# Patient Record
Sex: Female | Born: 1940
Health system: Southern US, Community
[De-identification: ages and names within clinical notes are randomized; demographics above are authoritative.]

## PROBLEM LIST (undated history)

## (undated) DIAGNOSIS — M549 Dorsalgia, unspecified: Secondary | ICD-10-CM

## (undated) DIAGNOSIS — G8929 Other chronic pain: Secondary | ICD-10-CM

## (undated) DIAGNOSIS — K219 Gastro-esophageal reflux disease without esophagitis: Secondary | ICD-10-CM

## (undated) DIAGNOSIS — F32A Depression, unspecified: Secondary | ICD-10-CM

## (undated) DIAGNOSIS — F329 Major depressive disorder, single episode, unspecified: Secondary | ICD-10-CM

## (undated) DIAGNOSIS — F419 Anxiety disorder, unspecified: Secondary | ICD-10-CM

## (undated) DIAGNOSIS — M810 Age-related osteoporosis without current pathological fracture: Secondary | ICD-10-CM

## (undated) DIAGNOSIS — D649 Anemia, unspecified: Secondary | ICD-10-CM

## (undated) DIAGNOSIS — I639 Cerebral infarction, unspecified: Secondary | ICD-10-CM

## (undated) DIAGNOSIS — I10 Essential (primary) hypertension: Secondary | ICD-10-CM

## (undated) DIAGNOSIS — M797 Fibromyalgia: Secondary | ICD-10-CM

## (undated) DIAGNOSIS — M1990 Unspecified osteoarthritis, unspecified site: Secondary | ICD-10-CM

## (undated) HISTORY — PX: KNEE SURGERY: SHX244

## (undated) HISTORY — DX: Age-related osteoporosis without current pathological fracture: M81.0

## (undated) HISTORY — PX: GASTRIC BYPASS: SHX52

## (undated) HISTORY — PX: TONSILLECTOMY: SUR1361

## (undated) HISTORY — DX: Anxiety disorder, unspecified: F41.9

## (undated) HISTORY — PX: BACK SURGERY: SHX140

## (undated) HISTORY — DX: Depression, unspecified: F32.A

## (undated) HISTORY — DX: Major depressive disorder, single episode, unspecified: F32.9

## (undated) HISTORY — DX: Gastro-esophageal reflux disease without esophagitis: K21.9

## (undated) HISTORY — DX: Anemia, unspecified: D64.9

## (undated) HISTORY — DX: Fibromyalgia: M79.7

## (undated) HISTORY — PX: ABDOMINAL HYSTERECTOMY: SHX81

## (undated) HISTORY — DX: Unspecified osteoarthritis, unspecified site: M19.90

---

## 2010-02-02 DIAGNOSIS — G8929 Other chronic pain: Secondary | ICD-10-CM | POA: Insufficient documentation

## 2011-05-23 DIAGNOSIS — Z8673 Personal history of transient ischemic attack (TIA), and cerebral infarction without residual deficits: Secondary | ICD-10-CM | POA: Insufficient documentation

## 2012-04-25 DIAGNOSIS — I272 Pulmonary hypertension, unspecified: Secondary | ICD-10-CM | POA: Insufficient documentation

## 2012-04-25 DIAGNOSIS — Q249 Congenital malformation of heart, unspecified: Secondary | ICD-10-CM | POA: Insufficient documentation

## 2012-08-24 DIAGNOSIS — M961 Postlaminectomy syndrome, not elsewhere classified: Secondary | ICD-10-CM | POA: Insufficient documentation

## 2013-04-10 DIAGNOSIS — F419 Anxiety disorder, unspecified: Secondary | ICD-10-CM | POA: Insufficient documentation

## 2013-04-10 DIAGNOSIS — M792 Neuralgia and neuritis, unspecified: Secondary | ICD-10-CM | POA: Insufficient documentation

## 2015-09-17 DIAGNOSIS — H9190 Unspecified hearing loss, unspecified ear: Secondary | ICD-10-CM | POA: Insufficient documentation

## 2016-04-11 DIAGNOSIS — M19012 Primary osteoarthritis, left shoulder: Secondary | ICD-10-CM | POA: Insufficient documentation

## 2017-02-01 ENCOUNTER — Emergency Department (HOSPITAL_COMMUNITY)
Admission: EM | Admit: 2017-02-01 | Discharge: 2017-02-01 | Disposition: A | Discharge status: Home / Self Care | Payer: Medicare Other | Attending: Emergency Medicine | Admitting: Emergency Medicine

## 2017-02-01 ENCOUNTER — Emergency Department (HOSPITAL_COMMUNITY): Payer: Medicare Other

## 2017-02-01 ENCOUNTER — Encounter (HOSPITAL_COMMUNITY): Payer: Self-pay | Admitting: Emergency Medicine

## 2017-02-01 DIAGNOSIS — Z79899 Other long term (current) drug therapy: Secondary | ICD-10-CM | POA: Insufficient documentation

## 2017-02-01 DIAGNOSIS — R531 Weakness: Secondary | ICD-10-CM | POA: Insufficient documentation

## 2017-02-01 DIAGNOSIS — R112 Nausea with vomiting, unspecified: Secondary | ICD-10-CM | POA: Insufficient documentation

## 2017-02-01 DIAGNOSIS — R1033 Periumbilical pain: Secondary | ICD-10-CM | POA: Insufficient documentation

## 2017-02-01 DIAGNOSIS — I10 Essential (primary) hypertension: Secondary | ICD-10-CM | POA: Insufficient documentation

## 2017-02-01 DIAGNOSIS — R110 Nausea: Secondary | ICD-10-CM

## 2017-02-01 HISTORY — DX: Essential (primary) hypertension: I10

## 2017-02-01 HISTORY — DX: Other chronic pain: G89.29

## 2017-02-01 HISTORY — DX: Dorsalgia, unspecified: M54.9

## 2017-02-01 HISTORY — DX: Cerebral infarction, unspecified: I63.9

## 2017-02-01 LAB — COMPREHENSIVE METABOLIC PANEL
ALK PHOS: 56 U/L (ref 38–126)
ALT: 34 U/L (ref 14–54)
AST: 46 U/L — AB (ref 15–41)
Albumin: 3.8 g/dL (ref 3.5–5.0)
Anion gap: 6 (ref 5–15)
BUN: 11 mg/dL (ref 6–20)
CHLORIDE: 99 mmol/L — AB (ref 101–111)
CO2: 34 mmol/L — AB (ref 22–32)
CREATININE: 0.6 mg/dL (ref 0.44–1.00)
Calcium: 8.9 mg/dL (ref 8.9–10.3)
GFR calc Af Amer: >60 mL/min (ref >60)
GFR calc non Af Amer: >60 mL/min (ref >60)
Glucose, Bld: 115 mg/dL — ABNORMAL HIGH (ref 65–99)
Potassium: 3.1 mmol/L — ABNORMAL LOW (ref 3.5–5.1)
SODIUM: 139 mmol/L (ref 135–145)
Total Bilirubin: 0.7 mg/dL (ref 0.3–1.2)
Total Protein: 6.2 g/dL — ABNORMAL LOW (ref 6.5–8.1)

## 2017-02-01 LAB — TROPONIN I: Troponin I: <0.03 ng/mL (ref <0.03)

## 2017-02-01 LAB — URINALYSIS, ROUTINE W REFLEX MICROSCOPIC
BILIRUBIN URINE: NEGATIVE
GLUCOSE, UA: NEGATIVE mg/dL
HGB URINE DIPSTICK: NEGATIVE
KETONES UR: NEGATIVE mg/dL
Leukocytes, UA: NEGATIVE
Nitrite: NEGATIVE
PROTEIN: NEGATIVE mg/dL
Specific Gravity, Urine: 1.024 (ref 1.005–1.030)
pH: 6 (ref 5.0–8.0)

## 2017-02-01 LAB — CBC
HCT: 36.7 % (ref 36.0–46.0)
Hemoglobin: 12.4 g/dL (ref 12.0–15.0)
MCH: 35.6 pg — AB (ref 26.0–34.0)
MCHC: 33.8 g/dL (ref 30.0–36.0)
MCV: 105.5 fL — AB (ref 78.0–100.0)
PLATELETS: 310 10*3/uL (ref 150–400)
RBC: 3.48 MIL/uL — ABNORMAL LOW (ref 3.87–5.11)
RDW: 11.8 % (ref 11.5–15.5)
WBC: 4.9 10*3/uL (ref 4.0–10.5)

## 2017-02-01 LAB — LIPASE, BLOOD: LIPASE: 16 U/L (ref 11–51)

## 2017-02-01 MED ORDER — SODIUM CHLORIDE 0.9 % IV BOLUS (SEPSIS)
1000.0000 mL | Freq: Once | INTRAVENOUS | Status: AC
  Administered 2017-02-01: 1000 mL via INTRAVENOUS

## 2017-02-01 MED ORDER — ONDANSETRON HCL 4 MG PO TABS: 4.0000 mg | ORAL_TABLET | Freq: Four times a day (QID) | ORAL | 0 refills | Status: DC

## 2017-02-01 MED ORDER — ONDANSETRON HCL 4 MG/2ML IJ SOLN
4.0000 mg | Freq: Once | INTRAMUSCULAR | Status: AC
  Administered 2017-02-01: 4 mg via INTRAVENOUS
  Filled 2017-02-01: qty 2

## 2017-02-01 NOTE — ED Provider Notes (Signed)
[facility]Y DEPT Provider Note   CSN: [id number] Arrival date & time: 02/01/17  1030   By signing my name below, I, Bobbie Stack, attest that this documentation has been prepared under the direction and in the presence of Donnetta Hutching, MD. Electronically Signed: Bobbie Stack, Scribe. 02/01/17. 2:11 PM. History   Chief Complaint Chief Complaint  Patient presents with  . Emesis     The history is provided by the patient. No language interpreter was used.    HPI Comments: [name] Rojas is a 76 y.o. female who presents to the Emergency Department complaining of generalized abdominal pain for the past couple of days. She reports associated nausea and vomiting since yesterday. The patient states that she typically has this pain after having one of her weak spells. She states that her weak spells typically last for about 20 minutes. Patient reports having having two gastric bypasses in the past, one 20 years ago and the other 8 years ago. The patient also reports having chronic back pain and has had two back surgeries in the past. She denies diarrhea. Per family: They believe that the patient has lost several pounds over the past 10 days. They states that she has been eating fine. The patient is currently taking Tramadol and Zoloft.   Past Medical History:  Diagnosis Date  . Chronic back pain   . Hypertension   . Stroke [facility])     There are no active problems to display for this patient.   Past Surgical History:  Procedure Laterality Date  . ABDOMINAL HYSTERECTOMY    . BACK SURGERY    . GASTRIC BYPASS    . TONSILLECTOMY      OB History    No data available       Home Medications    Prior to Admission medications   Medication Sig Start Date End Date Taking? Authorizing Provider  Cholecalciferol (VITAMIN D3 PO) Take 1 tablet by mouth daily.   Yes Historical Provider, MD  Cyanocobalamin (VITAMIN B-12 PO) Take 1 tablet by mouth daily.   Yes Historical Provider, MD    hydrochlorothiazide (HYDRODIURIL) 25 MG tablet Take 1 tablet by mouth daily. 11/27/16  Yes Historical Provider, MD  HYDROcodone-acetaminophen (NORCO) 10-325 MG tablet Take 1 tablet by mouth 3 (three) times daily.  01/30/17  Yes Historical Provider, MD  loratadine (CLARITIN) 10 MG tablet Take 10 mg by mouth daily.   Yes Historical Provider, MD  Multiple Vitamin (MULTIVITAMIN WITH MINERALS) TABS tablet Take 1 tablet by mouth daily.   Yes Historical Provider, MD  Omega-3 Fatty Acids (FISH OIL PO) Take 1 capsule by mouth daily.   Yes Historical Provider, MD  omeprazole (PRILOSEC) 40 MG capsule Take 1 capsule by mouth daily. 11/23/16  Yes Historical Provider, MD  sertraline (ZOLOFT) 50 MG tablet Take 1 tablet by mouth daily. 01/25/17  Yes Historical Provider, MD  traMADol (ULTRAM) 50 MG tablet Take 1 tablet by mouth 3 (three) times daily. 01/30/17  Yes Historical Provider, MD  clonazePAM (KLONOPIN) 0.5 MG tablet Take 1 tablet by mouth daily as needed for anxiety. 11/22/16   Historical Provider, MD  KLOR-CON M10 10 MEQ tablet Take 1 tablet by mouth daily. 12/30/16   Historical Provider, MD  ondansetron (ZOFRAN) 4 MG tablet Take 1 tablet (4 mg total) by mouth every 6 (six) hours. 02/01/17   Donnetta Hutching, MD    Family History No family history on file.  Social History Social History  Substance Use Topics  . Smoking status:  Never Smoker  . Smokeless tobacco: Never Used  . Alcohol use No     Allergies   Patient has no known allergies.   Review of Systems Review of Systems A complete 10 system review of systems was obtained and all systems are negative except as noted in the HPI and PMH.   Physical Exam Updated Vital Signs BP 140/83 (BP Location: Left Arm)   Pulse 82   Temp 99.8 F (37.7 C)   Resp 18   Ht 5\' 4"  (1.626 m)   Wt 131 lb (59.4 kg)   SpO2 96%   BMI 22.49 kg/m   Physical Exam  Constitutional: She is oriented to person, place, and time. She appears well-developed and  well-nourished.  HENT:  Head: Normocephalic and atraumatic.  Eyes: Conjunctivae are normal.  Neck: Neck supple.  Cardiovascular: Normal rate and regular rhythm.   Pulmonary/Chest: Effort normal and breath sounds normal.  Abdominal: Soft. Bowel sounds are normal.  Minimal periumbilical tenderness.  Musculoskeletal: Normal range of motion.  Neurological: She is alert and oriented to person, place, and time.  Skin: Skin is warm and dry.  Psychiatric: She has a normal mood and affect. Her behavior is normal.  Nursing note and vitals reviewed.    ED Treatments / Results  DIAGNOSTIC STUDIES: Oxygen Saturation is 99% on RA, normal by my interpretation.    COORDINATION OF CARE: 1:55 PM Discussed treatment plan with pt at bedside and pt agreed to plan. I will check the patient's UA, blood work, and Museum/gallery curatorX-ray.  Labs (all labs ordered are listed, but only abnormal results are displayed) Labs Reviewed  COMPREHENSIVE METABOLIC PANEL - Abnormal; Notable for the following:       Result Value   Potassium 3.1 (*)    Chloride 99 (*)    CO2 34 (*)    Glucose, Bld 115 (*)    Total Protein 6.2 (*)    AST 46 (*)    All other components within normal limits  CBC - Abnormal; Notable for the following:    RBC 3.48 (*)    MCV 105.5 (*)    MCH 35.6 (*)    All other components within normal limits  URINALYSIS, ROUTINE W REFLEX MICROSCOPIC - Abnormal; Notable for the following:    APPearance HAZY (*)    All other components within normal limits  LIPASE, BLOOD  TROPONIN I    EKG  EKG Interpretation  Date/Time:  Wednesday February 01 2017 14:19:13 EST Ventricular Rate:  68 PR Interval:    QRS Duration: 93 QT Interval:  371 QTC Calculation: 395 R Axis:   3 Text Interpretation:  Sinus rhythm Atrial premature complexes Borderline short PR interval Borderline T wave abnormalities Confirmed by Adriana SimasOOK  MD, Carlyle Achenbach ([id number]) on 02/01/2017 2:25:56 PM       Radiology Dg Chest 2 View  Result Date:  02/01/2017 CLINICAL DATA:  Cough. EXAM: CHEST  2 VIEW COMPARISON:  No recent . FINDINGS: Mediastinum hilar structures normal. Lungs are clear. Mild cardiomegaly. No pulmonary venous congestion. No acute bony abnormality. Degenerative changes thoracic spine. Surgical clips and sutures upper abdomen. IMPRESSION: Mild cardiomegaly. No pulmonary venous congestion. No acute pulmonary disease. Electronically Signed   By: Maisie Fushomas  Register   On: 02/01/2017 14:47    Procedures Procedures (including critical care time)  Medications Ordered in ED Medications  sodium chloride 0.9 % bolus 1,000 mL (1,000 mLs Intravenous New Bag/Given 02/01/17 1348)  ondansetron (ZOFRAN) injection 4 mg (4 mg Intravenous Given 02/01/17  1348)     Initial Impression / Assessment and Plan / ED Course  I have reviewed the triage vital signs and the nursing notes.  Pertinent labs & imaging results that were available during my care of the patient were reviewed by me and considered in my medical decision making (see chart for details).     Patient is in no acute distress. Labs, chest Xray, UA, EKG all negative for acute findings.  Discharge medications Zofran 4 mg.  Final Clinical Impressions(s) / ED Diagnoses   Final diagnoses:  Weakness  Nausea    New Prescriptions New Prescriptions   ONDANSETRON (ZOFRAN) 4 MG TABLET    Take 1 tablet (4 mg total) by mouth every 6 (six) hours.   I personally performed the services described in this documentation, which was scribed in my presence. The recorded information has been reviewed and is accurate.     Donnetta Hutching, MD 02/01/17 1536

## 2017-02-01 NOTE — Discharge Instructions (Signed)
Tests showed no life-threatening condition. Prescription for nausea medicine. Follow-up your primary care doctor.

## 2017-02-01 NOTE — ED Triage Notes (Signed)
Pt c/o not feeling well x 1 week with n/v since yesterday. Denies diarrhea. Pt c/o chronic back pain.

## 2017-03-23 ENCOUNTER — Ambulatory Visit: Payer: Medicare Other | Admitting: Family Medicine

## 2017-03-30 ENCOUNTER — Encounter: Payer: Self-pay | Admitting: Family Medicine

## 2017-03-30 ENCOUNTER — Ambulatory Visit (INDEPENDENT_AMBULATORY_CARE_PROVIDER_SITE_OTHER): Payer: Medicare Other | Admitting: Family Medicine

## 2017-03-30 VITALS — BP 126/74 | HR 76 | Temp 97.3°F | Resp 16 | Ht 64.0 in | Wt 137.1 lb

## 2017-03-30 DIAGNOSIS — Z7689 Persons encountering health services in other specified circumstances: Secondary | ICD-10-CM | POA: Diagnosis not present

## 2017-03-30 DIAGNOSIS — M961 Postlaminectomy syndrome, not elsewhere classified: Secondary | ICD-10-CM

## 2017-03-30 DIAGNOSIS — K219 Gastro-esophageal reflux disease without esophagitis: Secondary | ICD-10-CM

## 2017-03-30 DIAGNOSIS — I10 Essential (primary) hypertension: Secondary | ICD-10-CM

## 2017-03-30 DIAGNOSIS — E349 Endocrine disorder, unspecified: Secondary | ICD-10-CM | POA: Diagnosis not present

## 2017-03-30 DIAGNOSIS — M797 Fibromyalgia: Secondary | ICD-10-CM | POA: Insufficient documentation

## 2017-03-30 DIAGNOSIS — L03032 Cellulitis of left toe: Secondary | ICD-10-CM | POA: Diagnosis not present

## 2017-03-30 DIAGNOSIS — Z9114 Patient's other noncompliance with medication regimen: Secondary | ICD-10-CM | POA: Diagnosis not present

## 2017-03-30 DIAGNOSIS — G8929 Other chronic pain: Secondary | ICD-10-CM

## 2017-03-30 DIAGNOSIS — M818 Other osteoporosis without current pathological fracture: Secondary | ICD-10-CM | POA: Diagnosis not present

## 2017-03-30 DIAGNOSIS — E559 Vitamin D deficiency, unspecified: Secondary | ICD-10-CM | POA: Diagnosis not present

## 2017-03-30 DIAGNOSIS — L600 Ingrowing nail: Secondary | ICD-10-CM | POA: Diagnosis not present

## 2017-03-30 DIAGNOSIS — M79672 Pain in left foot: Secondary | ICD-10-CM | POA: Diagnosis not present

## 2017-03-30 MED ORDER — POTASSIUM CHLORIDE CRYS ER 10 MEQ PO TBCR: 10.0000 meq | EXTENDED_RELEASE_TABLET | Freq: Every day | ORAL | 3 refills | Status: DC

## 2017-03-30 MED ORDER — TIZANIDINE HCL 4 MG PO TABS: 4.0000 mg | ORAL_TABLET | Freq: Four times a day (QID) | ORAL | 0 refills | Status: DC | PRN

## 2017-03-30 MED ORDER — VENLAFAXINE HCL ER 37.5 MG PO CP24: 37.5000 mg | ORAL_CAPSULE | Freq: Every day | ORAL | 1 refills | Status: DC

## 2017-03-30 NOTE — Patient Instructions (Signed)
Take the vitamin D as prescribed Walk every day that you are able  Take the tizanidine - muscle relaxer - to help with the back pai  Referral placed to pain clinic  Take the venlafaxine for anxiety Take one a day for 2 weeks If tolerated take 2 a day after this  See me in a month Need PE at that time

## 2017-03-30 NOTE — Progress Notes (Signed)
Chief Complaint  Patient presents with  . Establish Care   New to establish Extensive old records available in the extended record Primary care through [facility] in [place], [place] to [place] a couple of months ago to be close to son  Patient has long history of chronic pain and was followed through a Pain clinic in [place].  She has run out of her pain medicine because she took more than prescribed and is requesting enough for a "few days", even if it is only Tramadol. I explained that I do not do chronic pain and will not be giving her controlled substances.  I gave her tizanidine as a muscle relaxer as a one time Rx to assist her.  I referred her to a local pain clinic.  Patient has a long history of anxiety.  Prefers to take "valium or ativan or klonopin" because these work [place].  Has been on a number of other medicines including lexapro, sertraline, duloxetine, wellbutrin, buspar with issues of noncompliance and not following through with psychiatry consultations. I discussed with her I will NOT give her a benzo- derivative.  I will try effexor XR, and if this does not work OR causes side effects she will be referred to psychiatry for care..  Past history obesity with weight over 300 lbs and bariatric surgery X 2.  She didn't follow yo with vitamin supplement and became very vit D deficient.  Her DEXA showed over 25% reductin in bone mass from 2008 until 2018 and she now has severe osteoporosis.  Needs calcium and vitamin D before she can be put on a bisphosphonate.  Discussed calcium, vitamin D, weight bearing exercise.  History of BP.  Well controlled.  History of stroke.  No residual deficit.  Refuses Statin.  Is not on baby ASA.  Is told to go back on.  Chooses at 6 not to have mammogram or colon cancer screening.   Patient Active Problem List   Diagnosis Date Noted  . Fibromyalgia 03/30/2017  . History of medication noncompliance 03/30/2017  . Osteoporosis of lower leg  associated with endocrine disorder 03/30/2017  . Primary osteoarthritis of left shoulder 04/11/2016  . Hearing difficulty 09/17/2015  . Neuropathic pain 04/10/2013  . Generalized anxiety disorder 04/10/2013  . Failed back syndrome 08/24/2012  . Pulmonary hypertension 04/25/2012  . Congenital heart disease with intracardiac shunting 04/25/2012  . History of stroke 05/23/2011  . Status post total knee replacement 02/03/2010  . GERD (gastroesophageal reflux disease) 02/03/2010  . Chronic pain 02/02/2010  . Essential hypertension 01/15/2010  . Status post bariatric surgery 04/30/2009    Outpatient Encounter Prescriptions as of 03/30/2017  Medication Sig  . aspirin EC 81 MG tablet Take by mouth.  . calcium citrate-vitamin D 500-400 MG-UNIT chewable tablet Chew by mouth.  . gabapentin (NEURONTIN) 100 MG capsule Take 100 mg by mouth 2 (two) times daily.  . hydrochlorothiazide (HYDRODIURIL) 25 MG tablet Take 1 tablet by mouth daily.  Marland Kitchen HYDROcodone-acetaminophen (NORCO) 10-325 MG tablet Take 1 tablet by mouth 3 (three) times daily.   . IRON PO Take by mouth.  . loratadine (CLARITIN) 10 MG tablet Take 10 mg by mouth daily.  Marland Kitchen omeprazole (PRILOSEC) 40 MG capsule Take 1 capsule by mouth daily.  . potassium chloride (KLOR-CON M10) 10 MEQ tablet Take 1 tablet (10 mEq total) by mouth daily.  . traMADol (ULTRAM) 50 MG tablet Take 1 tablet by mouth 3 (three) times daily.  . Vitamin D, Ergocalciferol, (DRISDOL) 50000 units  CAPS capsule Take 50,000 Units by mouth every 7 (seven) days.  . [DISCONTINUED] KLOR-CON M10 10 MEQ tablet Take 1 tablet by mouth daily.  . [DISCONTINUED] Vitamin D, Ergocalciferol, (DRISDOL) 50000 units CAPS capsule Take by mouth.  Marland Kitchen tiZANidine (ZANAFLEX) 4 MG tablet Take 1 tablet (4 mg total) by mouth every 6 (six) hours as needed for muscle spasms.  Marland Kitchen venlafaxine XR (EFFEXOR XR) 37.5 MG 24 hr capsule Take 1 capsule (37.5 mg total) by mouth daily with breakfast.   No  facility-administered encounter medications on file as of 03/30/2017.     Past Medical History:  Diagnosis Date  . Anemia   . Anxiety   . Arthritis   . Chronic back pain   . Depression   . GERD (gastroesophageal reflux disease)   . Hypertension   . Osteoporosis   . Stroke [facility])     Past Surgical History:  Procedure Laterality Date  . ABDOMINAL HYSTERECTOMY     bleeding  . BACK SURGERY    . GASTRIC BYPASS    . KNEE SURGERY    . TONSILLECTOMY      Social History   Social History  . Marital status: Widowed    Spouse name: N/A  . Number of children: N/A  . Years of education: N/A   Occupational History  . Not on file.   Social History Main Topics  . Smoking status: Never Smoker  . Smokeless tobacco: Never Used  . Alcohol use No  . Drug use: No  . Sexual activity: Not Currently   Other Topics Concern  . Not on file   Social History Narrative  . No narrative on file    Family History  Problem Relation Age of Onset  . Arthritis Mother   . Asthma Mother   . Heart disease Mother   . Early death Father     trauma    Review of Systems  Constitutional: Negative for chills, fever and weight loss.  HENT: Negative for congestion and hearing loss.   Eyes: Negative for blurred vision and pain.  Respiratory: Negative for cough and shortness of breath.   Cardiovascular: Negative for chest pain and leg swelling.  Gastrointestinal: Negative for abdominal pain, constipation, diarrhea and heartburn.       Controlled  Genitourinary: Negative for dysuria and frequency.  Musculoskeletal: Positive for back pain and joint pain. Negative for falls and myalgias.  Neurological: Negative for dizziness, seizures and headaches.  Psychiatric/Behavioral: Negative for depression. The patient is nervous/anxious and has insomnia.     BP 126/74 (BP Location: Right Arm, Patient Position: Sitting, Cuff Size: Normal)   Pulse 76   Temp 97.3 F (36.3 C) (Temporal)   Resp 16   Ht 5'  4" (1.626 m)   Wt 137 lb 1.9 oz (62.2 kg)   SpO2 98%   BMI 23.54 kg/m   Physical Exam  Constitutional: She is oriented to person, place, and time. She appears well-developed and well-nourished. No distress.  Pleasant, talkative  HENT:  Head: Normocephalic and atraumatic.  Mouth/Throat: Oropharynx is clear and moist.  Eyes: Conjunctivae are normal. Pupils are equal, round, and reactive to light.  Neck: Normal range of motion.  Cardiovascular: Normal rate, regular rhythm and normal heart sounds.   Pulmonary/Chest: Effort normal and breath sounds normal.  Lymphadenopathy:    She has no cervical adenopathy.  Neurological: She is oriented to person, place, and time.  Psychiatric: She has a normal mood and affect. Her behavior is normal.  ASSESSMENT/PLAN:   1. Essential hypertension  - CBC - Comprehensive metabolic panel - Lipid panel - Urinalysis, Routine w reflex microscopic  2. Gastroesophageal reflux disease, esophagitis presence not specified   3. Other chronic pain  - Ambulatory referral to Pain Clinic  4. Failed back syndrome  - Ambulatory referral to Pain Clinic  5. History of medication noncompliance   6. Osteoporosis of lower leg associated with endocrine disorder  7. Vitamin D deficiency disease  - VITAMIN D 25 Hydroxy (Vit-D Deficiency, Fractures)  8. Encounter to establish care with new doctor  Greater than 50% of this visit was spent in counseling and coordinating care.  Total face to face time:  45 min    Patient Instructions  Take the vitamin D as prescribed Walk every day that you are able  Take the tizanidine - muscle relaxer - to help with the back pai  Referral placed to pain clinic  Take the venlafaxine for anxiety Take one a day for 2 weeks If tolerated take 2 a day after this  See me in a month Need PE at that time   Eustace Moore, MD

## 2017-03-31 ENCOUNTER — Telehealth: Payer: Self-pay | Admitting: Family Medicine

## 2017-03-31 NOTE — Telephone Encounter (Signed)
Patient calling to find out the status of an appointment with a  pain doctor.  She states this was discussed at last visit.

## 2017-03-31 NOTE — Telephone Encounter (Signed)
Please change pcp to dr nelson 

## 2017-04-03 NOTE — Telephone Encounter (Signed)
I placed a referral for her and do not know the status of this request

## 2017-04-04 NOTE — Telephone Encounter (Signed)
Faxed referral form and notes 04/03/17 to [facility].  She is under review, they will contact patient for an appt. I will call for updates every 2 days.

## 2017-04-13 DIAGNOSIS — M79671 Pain in right foot: Secondary | ICD-10-CM | POA: Diagnosis not present

## 2017-04-13 DIAGNOSIS — L03031 Cellulitis of right toe: Secondary | ICD-10-CM | POA: Diagnosis not present

## 2017-04-13 DIAGNOSIS — M79674 Pain in right toe(s): Secondary | ICD-10-CM | POA: Diagnosis not present

## 2017-04-13 DIAGNOSIS — L600 Ingrowing nail: Secondary | ICD-10-CM | POA: Diagnosis not present

## 2017-04-21 ENCOUNTER — Ambulatory Visit (INDEPENDENT_AMBULATORY_CARE_PROVIDER_SITE_OTHER): Payer: Medicare Other | Admitting: Family Medicine

## 2017-04-21 ENCOUNTER — Encounter: Payer: Self-pay | Admitting: Family Medicine

## 2017-04-21 VITALS — BP 146/92 | HR 88 | Temp 97.1°F | Resp 16 | Ht 64.0 in | Wt 136.0 lb

## 2017-04-21 DIAGNOSIS — N390 Urinary tract infection, site not specified: Secondary | ICD-10-CM | POA: Diagnosis not present

## 2017-04-21 DIAGNOSIS — F411 Generalized anxiety disorder: Secondary | ICD-10-CM | POA: Diagnosis not present

## 2017-04-21 LAB — POCT URINALYSIS DIPSTICK
BILIRUBIN UA: NEGATIVE
Glucose, UA: NEGATIVE
Nitrite, UA: NEGATIVE
PH UA: 5.5 (ref 5.0–8.0)
PROTEIN UA: NEGATIVE
RBC UA: NEGATIVE
Spec Grav, UA: 1.02 (ref 1.010–1.025)
Urobilinogen, UA: 0.2 E.U./dL

## 2017-04-21 MED ORDER — CIPROFLOXACIN HCL 250 MG PO TABS: 250.0000 mg | ORAL_TABLET | Freq: Two times a day (BID) | ORAL | 0 refills | Status: DC

## 2017-04-21 MED ORDER — VENLAFAXINE HCL ER 75 MG PO CP24: 75.0000 mg | ORAL_CAPSULE | Freq: Every day | ORAL | 3 refills | Status: DC

## 2017-04-21 NOTE — Progress Notes (Signed)
Chief Complaint  Patient presents with  . Urinary Tract Infection   Seen as a walk in for urine infection.  Has burning and frequency for 2 d.  No blood noted, no fever or flank pain. No suprapubic pain or incontinence.  Not recurrent, has not had one in years.  Also complains of anxiety.  Effexor not working.  I am going to increase her dose as she is only a starting dose of 37.5.  I explained it takes time to work.  She is wanting a benzodiazepine.  I will not prescribe.    Requests referral to psychiatry.  Patient Active Problem List   Diagnosis Date Noted  . Fibromyalgia 03/30/2017  . History of medication noncompliance 03/30/2017  . Osteoporosis of lower leg associated with endocrine disorder 03/30/2017  . Primary osteoarthritis of left shoulder 04/11/2016  . Hearing difficulty 09/17/2015  . Neuropathic pain 04/10/2013  . Generalized anxiety disorder 04/10/2013  . Failed back syndrome 08/24/2012  . Pulmonary hypertension (HCC) 04/25/2012  . Congenital heart disease with intracardiac shunting 04/25/2012  . History of stroke 05/23/2011  . Status post total knee replacement 02/03/2010  . GERD (gastroesophageal reflux disease) 02/03/2010  . Chronic pain 02/02/2010  . Essential hypertension 01/15/2010  . Status post bariatric surgery 04/30/2009    Outpatient Encounter Prescriptions as of 04/21/2017  Medication Sig  . aspirin EC 81 MG tablet Take by mouth.  . calcium citrate-vitamin D 500-400 MG-UNIT chewable tablet Chew by mouth.  . gabapentin (NEURONTIN) 100 MG capsule Take 100 mg by mouth 2 (two) times daily.  . hydrochlorothiazide (HYDRODIURIL) 25 MG tablet Take 1 tablet by mouth daily.  Marland Kitchen. HYDROcodone-acetaminophen (NORCO) 10-325 MG tablet Take 1 tablet by mouth 3 (three) times daily.   . IRON PO Take by mouth.  . loratadine (CLARITIN) 10 MG tablet Take 10 mg by mouth daily.  Marland Kitchen. omeprazole (PRILOSEC) 40 MG capsule Take 1 capsule by mouth daily.  . potassium chloride  (KLOR-CON M10) 10 MEQ tablet Take 1 tablet (10 mEq total) by mouth daily.  Marland Kitchen. tiZANidine (ZANAFLEX) 4 MG tablet Take 1 tablet (4 mg total) by mouth every 6 (six) hours as needed for muscle spasms.  . traMADol (ULTRAM) 50 MG tablet Take 1 tablet by mouth 3 (three) times daily.  Marland Kitchen. venlafaxine XR (EFFEXOR-XR) 75 MG 24 hr capsule Take 1 capsule (75 mg total) by mouth daily with breakfast.  . Vitamin D, Ergocalciferol, (DRISDOL) 50000 units CAPS capsule Take 50,000 Units by mouth every 7 (seven) days.  . [DISCONTINUED] venlafaxine XR (EFFEXOR XR) 37.5 MG 24 hr capsule Take 1 capsule (37.5 mg total) by mouth daily with breakfast.  . ciprofloxacin (CIPRO) 250 MG tablet Take 1 tablet (250 mg total) by mouth 2 (two) times daily.   No facility-administered encounter medications on file as of 04/21/2017.     Allergies  Allergen Reactions  . Codeine Nausea And Vomiting  . Duloxetine Nausea Only    Review of Systems  Constitutional: Negative for chills, fever and unexpected weight change.  HENT: Negative for congestion.   Eyes: Negative for visual disturbance.  Respiratory: Negative for cough and shortness of breath.   Cardiovascular: Negative for chest pain and palpitations.  Gastrointestinal: Negative for nausea and vomiting.  Genitourinary: Positive for dysuria and frequency. Negative for flank pain and hematuria.  Musculoskeletal: Negative for back pain.  Psychiatric/Behavioral: Negative for dysphoric mood. The patient is nervous/anxious.    BP (!) 146/92 (BP Location: Right Arm, Patient Position: Sitting,  Cuff Size: Normal)   Pulse 88   Temp 97.1 F (36.2 C) (Temporal)   Resp 16   Ht 5\' 4"  (1.626 m)   Wt 136 lb (61.7 kg)   SpO2 96%   BMI 23.34 kg/m   Physical Exam  Constitutional: She is oriented to person, place, and time. She appears well-developed and well-nourished. No distress.  HENT:  Head: Normocephalic and atraumatic.  Mouth/Throat: Oropharynx is clear and moist.    Cardiovascular: Normal rate, regular rhythm and normal heart sounds.   Pulmonary/Chest: Effort normal and breath sounds normal. She exhibits no tenderness.  Abdominal: Soft. Bowel sounds are normal. There is no tenderness.  Neurological: She is alert and oriented to person, place, and time.  Psychiatric: She has a normal mood and affect. Her behavior is normal.   Results for orders placed or performed in visit on 04/21/17  POCT urinalysis dipstick  Result Value Ref Range   Color, UA yellow    Clarity, UA sl cloudy    Glucose, UA neg    Bilirubin, UA neg    Ketones, UA trace    Spec Grav, UA 1.020 1.010 - 1.025   Blood, UA neg    pH, UA 5.5 5.0 - 8.0   Protein, UA neg    Urobilinogen, UA 0.2 0.2 or 1.0 E.U./dL   Nitrite, UA neg    Leukocytes, UA Trace (A) Negative     ASSESSMENT/PLAN:  1. Urinary tract infection without hematuria, site unspecified  - POCT urinalysis dipstick - Urine culture  2. Anxiety, generalized  - Ambulatory referral to Psychiatry   Patient Instructions  Take the cipro twice a day Drink lots of water Increase the effexor ( venlafaxine ) to 75 mg a day I will refer to psychiatry to help with the anxiety   Eustace Moore, MD

## 2017-04-21 NOTE — Patient Instructions (Signed)
Take the cipro twice a day Drink lots of water Increase the effexor ( venlafaxine ) to 75 mg a day I will refer to psychiatry to help with the anxiety

## 2017-04-24 DIAGNOSIS — I10 Essential (primary) hypertension: Secondary | ICD-10-CM | POA: Diagnosis not present

## 2017-04-24 DIAGNOSIS — E559 Vitamin D deficiency, unspecified: Secondary | ICD-10-CM | POA: Diagnosis not present

## 2017-04-24 LAB — LIPID PANEL
CHOL/HDL RATIO: 2.2 ratio (ref <5.0)
CHOLESTEROL: 150 mg/dL (ref <200)
HDL: 69 mg/dL (ref >50)
LDL CALC: 70 mg/dL (ref <100)
Triglycerides: 55 mg/dL (ref <150)
VLDL: 11 mg/dL (ref <30)

## 2017-04-24 LAB — CBC
HCT: 35.4 % (ref 35.0–45.0)
Hemoglobin: 11.8 g/dL (ref 11.7–15.5)
MCH: 35.5 pg — AB (ref 27.0–33.0)
MCHC: 33.3 g/dL (ref 32.0–36.0)
MCV: 106.6 fL — AB (ref 80.0–100.0)
MPV: 10.5 fL (ref 7.5–12.5)
PLATELETS: 240 10*3/uL (ref 140–400)
RBC: 3.32 MIL/uL — AB (ref 3.80–5.10)
RDW: 12.4 % (ref 11.0–15.0)
WBC: 3.5 10*3/uL — ABNORMAL LOW (ref 3.8–10.8)

## 2017-04-24 LAB — COMPREHENSIVE METABOLIC PANEL
ALT: 57 U/L — ABNORMAL HIGH (ref 6–29)
AST: 59 U/L — ABNORMAL HIGH (ref 10–35)
Albumin: 3.9 g/dL (ref 3.6–5.1)
Alkaline Phosphatase: 53 U/L (ref 33–130)
BUN: 10 mg/dL (ref 7–25)
CHLORIDE: 104 mmol/L (ref 98–110)
CO2: 31 mmol/L (ref 20–31)
CREATININE: 0.59 mg/dL — AB (ref 0.60–0.93)
Calcium: 9.1 mg/dL (ref 8.6–10.4)
GLUCOSE: 92 mg/dL (ref 65–99)
POTASSIUM: 3.9 mmol/L (ref 3.5–5.3)
SODIUM: 141 mmol/L (ref 135–146)
Total Bilirubin: 0.5 mg/dL (ref 0.2–1.2)
Total Protein: 6.1 g/dL (ref 6.1–8.1)

## 2017-04-24 LAB — URINE CULTURE

## 2017-04-25 ENCOUNTER — Ambulatory Visit (INDEPENDENT_AMBULATORY_CARE_PROVIDER_SITE_OTHER): Payer: Medicare Other | Admitting: Family Medicine

## 2017-04-25 ENCOUNTER — Encounter: Payer: Self-pay | Admitting: Family Medicine

## 2017-04-25 VITALS — BP 120/76 | HR 80 | Temp 98.6°F | Resp 18 | Ht 64.0 in | Wt 142.0 lb

## 2017-04-25 DIAGNOSIS — R252 Cramp and spasm: Secondary | ICD-10-CM | POA: Diagnosis not present

## 2017-04-25 LAB — VITAMIN D 25 HYDROXY (VIT D DEFICIENCY, FRACTURES): VIT D 25 HYDROXY: 31 ng/mL (ref 30–100)

## 2017-04-25 LAB — URINALYSIS, ROUTINE W REFLEX MICROSCOPIC
BILIRUBIN URINE: NEGATIVE
Glucose, UA: NEGATIVE
HGB URINE DIPSTICK: NEGATIVE
KETONES UR: NEGATIVE
Leukocytes, UA: NEGATIVE
NITRITE: NEGATIVE
Protein, ur: NEGATIVE
SPECIFIC GRAVITY, URINE: 1.017 (ref 1.001–1.035)
pH: 6 (ref 5.0–8.0)

## 2017-04-25 MED ORDER — TIZANIDINE HCL 4 MG PO TABS
4.0000 mg | ORAL_TABLET | Freq: Four times a day (QID) | ORAL | 0 refills | Status: DC | PRN
Start: 2017-04-25 — End: 2017-05-22

## 2017-04-25 NOTE — Progress Notes (Signed)
Chief Complaint  Patient presents with  . Leg Pain    x 48 hours   Acte visit Hand and foot cramps since the weekend No change in diet or activity Recent labs normal I didn't check thyroid She says she used to have thyroid disease   Patient Active Problem List   Diagnosis Date Noted  . Fibromyalgia 03/30/2017  . History of medication noncompliance 03/30/2017  . Osteoporosis of lower leg associated with endocrine disorder 03/30/2017  . Primary osteoarthritis of left shoulder 04/11/2016  . Hearing difficulty 09/17/2015  . Neuropathic pain 04/10/2013  . Generalized anxiety disorder 04/10/2013  . Failed back syndrome 08/24/2012  . Pulmonary hypertension (HCC) 04/25/2012  . Congenital heart disease with intracardiac shunting 04/25/2012  . History of stroke 05/23/2011  . Status post total knee replacement 02/03/2010  . GERD (gastroesophageal reflux disease) 02/03/2010  . Chronic pain 02/02/2010  . Essential hypertension 01/15/2010  . Status post bariatric surgery 04/30/2009    Outpatient Encounter Prescriptions as of 04/25/2017  Medication Sig  . aspirin EC 81 MG tablet Take by mouth.  . calcium citrate-vitamin D 500-400 MG-UNIT chewable tablet Chew by mouth.  . ciprofloxacin (CIPRO) 250 MG tablet Take 1 tablet (250 mg total) by mouth 2 (two) times daily.  Marland Kitchen. gabapentin (NEURONTIN) 100 MG capsule Take 100 mg by mouth 2 (two) times daily.  . hydrochlorothiazide (HYDRODIURIL) 25 MG tablet Take 1 tablet by mouth daily.  Marland Kitchen. HYDROcodone-acetaminophen (NORCO) 10-325 MG tablet Take 1 tablet by mouth 3 (three) times daily.   . IRON PO Take by mouth.  . loratadine (CLARITIN) 10 MG tablet Take 10 mg by mouth daily.  Marland Kitchen. omeprazole (PRILOSEC) 40 MG capsule Take 1 capsule by mouth daily.  . potassium chloride (KLOR-CON M10) 10 MEQ tablet Take 1 tablet (10 mEq total) by mouth daily.  Marland Kitchen. tiZANidine (ZANAFLEX) 4 MG tablet Take 1 tablet (4 mg total) by mouth every 6 (six) hours as needed for  muscle spasms.  . traMADol (ULTRAM) 50 MG tablet Take 1 tablet by mouth 3 (three) times daily.  Marland Kitchen. venlafaxine XR (EFFEXOR-XR) 75 MG 24 hr capsule Take 1 capsule (75 mg total) by mouth daily with breakfast.  . Vitamin D, Ergocalciferol, (DRISDOL) 50000 units CAPS capsule Take 50,000 Units by mouth every 7 (seven) days.  . [DISCONTINUED] tiZANidine (ZANAFLEX) 4 MG tablet Take 1 tablet (4 mg total) by mouth every 6 (six) hours as needed for muscle spasms.   No facility-administered encounter medications on file as of 04/25/2017.     Allergies  Allergen Reactions  . Codeine Nausea And Vomiting  . Duloxetine Nausea Only   Results for [name] GosselinBEST, [name] (MRN [id number]) as of 04/25/2017 16:43  Ref. Range 04/24/2017 09:08  Sodium Latest Ref Range: 135 - 146 mmol/L 141  Potassium Latest Ref Range: 3.5 - 5.3 mmol/L 3.9  Chloride Latest Ref Range: 98 - 110 mmol/L 104  CO2 Latest Ref Range: 20 - 31 mmol/L 31  Glucose Latest Ref Range: 65 - 99 mg/dL 92  BUN Latest Ref Range: 7 - 25 mg/dL 10  Creatinine Latest Ref Range: 0.60 - 0.93 mg/dL 5.780.59 (L)  Calcium Latest Ref Range: 8.6 - 10.4 mg/dL 9.1  Alkaline Phosphatase Latest Ref Range: 33 - 130 U/L 53  Albumin Latest Ref Range: 3.6 - 5.1 g/dL 3.9  AST Latest Ref Range: 10 - 35 U/L 59 (H)  ALT Latest Ref Range: 6 - 29 U/L 57 (H)  Total Protein Latest Ref Range: 6.1 -  8.1 g/dL 6.1  Total Bilirubin Latest Ref Range: 0.2 - 1.2 mg/dL 0.5  Total CHOL/HDL Ratio Latest Ref Range: <5.0 Ratio 2.2  Cholesterol Latest Ref Range: <200 mg/dL 782  HDL Cholesterol Latest Ref Range: >50 mg/dL 69  LDL (calc) Latest Ref Range: <100 mg/dL 70  Triglycerides Latest Ref Range: <150 mg/dL 55  VLDL Latest Ref Range: <30 mg/dL 11  Vitamin D, 95-AOZHYQM Latest Ref Range: 30 - 100 ng/mL 31  WBC Latest Ref Range: 3.8 - 10.8 K/uL 3.5 (L)  RBC Latest Ref Range: 3.80 - 5.10 MIL/uL 3.32 (L)  Hemoglobin Latest Ref Range: 11.7 - 15.5 g/dL 57.8  HCT Latest Ref Range: 35.0 - 45.0 % 35.4    MCV Latest Ref Range: 80.0 - 100.0 fL 106.6 (H)  MCH Latest Ref Range: 27.0 - 33.0 pg 35.5 (H)  MCHC Latest Ref Range: 32.0 - 36.0 g/dL 46.9  RDW Latest Ref Range: 11.0 - 15.0 % 12.4  Platelets Latest Ref Range: 140 - 400 K/uL 240  MPV Latest Ref Range: 7.5 - 12.5 fL 10.5  URINALYSIS, ROUTINE W REFLEX MICROSCOPIC Unknown Rpt  Appearance Latest Ref Range: CLEAR  CLEAR  Bilirubin Urine Latest Ref Range: NEGATIVE  NEGATIVE  Color, Urine Latest Ref Range: YELLOW  DARK YELLOW  Glucose Latest Ref Range: NEGATIVE  NEGATIVE  Hgb urine dipstick Latest Ref Range: NEGATIVE  NEGATIVE  Ketones, ur Latest Ref Range: NEGATIVE  NEGATIVE  Leukocytes, UA Latest Ref Range: NEGATIVE  NEGATIVE  Nitrite Latest Ref Range: NEGATIVE  NEGATIVE  pH Latest Ref Range: 5.0 - 8.0  6.0  Protein Latest Ref Range: NEGATIVE  NEGATIVE   Review of Systems  Constitutional: Negative for activity change, appetite change and unexpected weight change.  HENT: Negative for congestion, dental problem, postnasal drip and rhinorrhea.   Eyes: Negative for redness and visual disturbance.  Respiratory: Negative for cough and shortness of breath.   Cardiovascular: Negative for chest pain, palpitations and leg swelling.  Gastrointestinal: Negative for abdominal pain, constipation and diarrhea.  Genitourinary: Negative for difficulty urinating and frequency.  Musculoskeletal: Negative for arthralgias and back pain.       Muscle cramps  Neurological: Negative for dizziness and headaches.  Psychiatric/Behavioral: Negative for dysphoric mood and sleep disturbance. The patient is nervous/anxious.     BP 120/76 (BP Location: Right Arm, Patient Position: Sitting, Cuff Size: Normal)   Pulse 80   Temp 98.6 F (37 C) (Oral)   Resp 18   Ht 5\' 4"  (1.626 m)   Wt 142 lb (64.4 kg)   SpO2 98%   BMI 24.37 kg/m   Physical Exam  Constitutional: She is oriented to person, place, and time. She appears well-developed and well-nourished. No  distress.  Cardiovascular: Normal rate, regular rhythm and normal heart sounds.   Pulmonary/Chest: Effort normal and breath sounds normal.  Musculoskeletal: Normal range of motion.  Strength and sensation, grip, dexterity hands WNL  Neurological: She is alert and oriented to person, place, and time. She displays normal reflexes. She exhibits normal muscle tone. Coordination normal.  Skin: Skin is warm and dry. No rash noted.  Psychiatric: She has a normal mood and affect. Her behavior is normal. Thought content normal.    ASSESSMENT/PLAN:  1. Muscle cramp Discussed hydration.  Vitamin supplements.  stretching. - TSH   Patient Instructions  Get the thyroid test done today I will send you a letter with your test results.  If there is anything of concern, we will call right away.  Take a B complex vitamin daily I have refilled the muscle relaxer See me in a month ( cancel appt for next week)   Eustace Moore, MD

## 2017-04-25 NOTE — Patient Instructions (Addendum)
Get the thyroid test done today I will send you a letter with your test results.  If there is anything of concern, we will call right away. Take a B complex vitamin daily I have refilled the muscle relaxer See me in a month ( cancel appt for next week)

## 2017-04-26 ENCOUNTER — Encounter: Payer: Self-pay | Admitting: Family Medicine

## 2017-04-26 DIAGNOSIS — L03031 Cellulitis of right toe: Secondary | ICD-10-CM | POA: Diagnosis not present

## 2017-04-26 DIAGNOSIS — M79674 Pain in right toe(s): Secondary | ICD-10-CM | POA: Diagnosis not present

## 2017-04-26 DIAGNOSIS — L03032 Cellulitis of left toe: Secondary | ICD-10-CM | POA: Diagnosis not present

## 2017-04-26 DIAGNOSIS — L600 Ingrowing nail: Secondary | ICD-10-CM | POA: Diagnosis not present

## 2017-04-26 LAB — TSH: TSH: 0.67 m[IU]/L

## 2017-05-01 ENCOUNTER — Ambulatory Visit (INDEPENDENT_AMBULATORY_CARE_PROVIDER_SITE_OTHER): Payer: Medicare Other | Admitting: Otolaryngology

## 2017-05-01 ENCOUNTER — Encounter: Payer: Medicare Other | Admitting: Family Medicine

## 2017-05-01 DIAGNOSIS — H903 Sensorineural hearing loss, bilateral: Secondary | ICD-10-CM

## 2017-05-17 ENCOUNTER — Other Ambulatory Visit: Payer: Self-pay | Admitting: Family Medicine

## 2017-05-17 NOTE — Telephone Encounter (Signed)
Seen and filled [phone]

## 2017-05-17 NOTE — Telephone Encounter (Signed)
Patient is requesting a refill for muscle relaxer [facility]ses [facility] in [place] Cb#: [phone]

## 2017-05-18 ENCOUNTER — Telehealth: Payer: Self-pay | Admitting: Family Medicine

## 2017-05-18 NOTE — Telephone Encounter (Signed)
Pt aware rx sent to pharmacy.

## 2017-05-19 NOTE — Telephone Encounter (Signed)
Patient left message on nurse line  05/18/17 @3 :23  Requesting her muscle relaxer.    Pt cb  [phone]

## 2017-05-22 MED ORDER — TIZANIDINE HCL 4 MG PO TABS: 4.0000 mg | ORAL_TABLET | Freq: Four times a day (QID) | ORAL | 0 refills | Status: DC | PRN

## 2017-05-22 NOTE — Telephone Encounter (Signed)
rx sent  Pt aware. 

## 2017-05-26 ENCOUNTER — Encounter: Payer: Medicare Other | Admitting: Family Medicine

## 2017-05-30 ENCOUNTER — Telehealth (HOSPITAL_COMMUNITY): Payer: Self-pay | Admitting: *Deleted

## 2017-05-30 NOTE — Telephone Encounter (Signed)
left voice message regarding an appointment. 

## 2017-06-05 DIAGNOSIS — M79605 Pain in left leg: Secondary | ICD-10-CM | POA: Diagnosis not present

## 2017-06-05 DIAGNOSIS — M25511 Pain in right shoulder: Secondary | ICD-10-CM | POA: Diagnosis not present

## 2017-06-05 DIAGNOSIS — M545 Low back pain: Secondary | ICD-10-CM | POA: Diagnosis not present

## 2017-06-05 DIAGNOSIS — M797 Fibromyalgia: Secondary | ICD-10-CM | POA: Diagnosis not present

## 2017-06-05 DIAGNOSIS — M25569 Pain in unspecified knee: Secondary | ICD-10-CM | POA: Diagnosis not present

## 2017-06-05 DIAGNOSIS — M25519 Pain in unspecified shoulder: Secondary | ICD-10-CM | POA: Diagnosis not present

## 2017-06-05 DIAGNOSIS — Z79899 Other long term (current) drug therapy: Secondary | ICD-10-CM | POA: Diagnosis not present

## 2017-06-07 DIAGNOSIS — M2042 Other hammer toe(s) (acquired), left foot: Secondary | ICD-10-CM | POA: Diagnosis not present

## 2017-06-07 DIAGNOSIS — M2022 Hallux rigidus, left foot: Secondary | ICD-10-CM | POA: Diagnosis not present

## 2017-06-07 DIAGNOSIS — M2021 Hallux rigidus, right foot: Secondary | ICD-10-CM | POA: Diagnosis not present

## 2017-06-07 DIAGNOSIS — M2041 Other hammer toe(s) (acquired), right foot: Secondary | ICD-10-CM | POA: Diagnosis not present
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
## 2017-06-12 ENCOUNTER — Encounter (HOSPITAL_COMMUNITY): Payer: Self-pay | Admitting: Psychiatry

## 2017-06-12 ENCOUNTER — Encounter (INDEPENDENT_AMBULATORY_CARE_PROVIDER_SITE_OTHER): Payer: Self-pay

## 2017-06-12 ENCOUNTER — Ambulatory Visit (INDEPENDENT_AMBULATORY_CARE_PROVIDER_SITE_OTHER): Payer: Medicare Other | Admitting: Psychiatry

## 2017-06-12 VITALS — BP 149/96 | HR 95 | Ht 64.0 in | Wt 149.0 lb

## 2017-06-12 DIAGNOSIS — F331 Major depressive disorder, recurrent, moderate: Secondary | ICD-10-CM

## 2017-06-12 DIAGNOSIS — F419 Anxiety disorder, unspecified: Secondary | ICD-10-CM

## 2017-06-12 MED ORDER — DULOXETINE HCL 30 MG PO CPEP: 30.0000 mg | ORAL_CAPSULE | Freq: Every day | ORAL | 1 refills | Status: DC

## 2017-06-12 MED ORDER — DULOXETINE HCL 30 MG PO CPEP: 30.0000 mg | ORAL_CAPSULE | Freq: Every day | ORAL | 0 refills | Status: DC

## 2017-06-12 NOTE — Patient Instructions (Signed)
1. Discontinue venlafaxine 2. Start duloxetine 30 mg daily 3. Return to clinic in one month for 30 mins 4. Referral to a therapist in this clinic

## 2017-06-13 ENCOUNTER — Encounter: Payer: Medicare Other | Admitting: Family Medicine

## 2017-06-19 ENCOUNTER — Ambulatory Visit (INDEPENDENT_AMBULATORY_CARE_PROVIDER_SITE_OTHER): Payer: Medicare Other | Admitting: Licensed Clinical Social Worker

## 2017-06-19 ENCOUNTER — Encounter (HOSPITAL_COMMUNITY): Payer: Self-pay | Admitting: Licensed Clinical Social Worker

## 2017-06-19 DIAGNOSIS — F330 Major depressive disorder, recurrent, mild: Secondary | ICD-10-CM | POA: Diagnosis not present
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
## 2017-06-20 DIAGNOSIS — Z79891 Long term (current) use of opiate analgesic: Secondary | ICD-10-CM | POA: Diagnosis not present

## 2017-06-20 DIAGNOSIS — M797 Fibromyalgia: Secondary | ICD-10-CM | POA: Diagnosis not present

## 2017-06-20 DIAGNOSIS — M25519 Pain in unspecified shoulder: Secondary | ICD-10-CM | POA: Diagnosis not present

## 2017-06-20 DIAGNOSIS — M25569 Pain in unspecified knee: Secondary | ICD-10-CM | POA: Diagnosis not present

## 2017-06-20 DIAGNOSIS — M545 Low back pain: Secondary | ICD-10-CM | POA: Diagnosis not present

## 2017-06-26 ENCOUNTER — Other Ambulatory Visit: Payer: Self-pay

## 2017-06-27 ENCOUNTER — Encounter: Payer: Self-pay | Admitting: Family Medicine

## 2017-06-27 ENCOUNTER — Other Ambulatory Visit: Payer: Self-pay | Admitting: Family Medicine

## 2017-06-27 ENCOUNTER — Ambulatory Visit (INDEPENDENT_AMBULATORY_CARE_PROVIDER_SITE_OTHER): Payer: Medicare Other | Admitting: Family Medicine

## 2017-06-27 VITALS — BP 120/76 | HR 89 | Temp 96.9°F | Resp 18 | Ht 64.5 in | Wt 141.5 lb

## 2017-06-27 DIAGNOSIS — E559 Vitamin D deficiency, unspecified: Secondary | ICD-10-CM | POA: Diagnosis not present

## 2017-06-27 DIAGNOSIS — K219 Gastro-esophageal reflux disease without esophagitis: Secondary | ICD-10-CM

## 2017-06-27 DIAGNOSIS — D7589 Other specified diseases of blood and blood-forming organs: Secondary | ICD-10-CM

## 2017-06-27 DIAGNOSIS — Z9884 Bariatric surgery status: Secondary | ICD-10-CM | POA: Diagnosis not present

## 2017-06-27 DIAGNOSIS — M810 Age-related osteoporosis without current pathological fracture: Secondary | ICD-10-CM | POA: Diagnosis not present

## 2017-06-27 DIAGNOSIS — Z1231 Encounter for screening mammogram for malignant neoplasm of breast: Secondary | ICD-10-CM

## 2017-06-27 DIAGNOSIS — Z1239 Encounter for other screening for malignant neoplasm of breast: Secondary | ICD-10-CM

## 2017-06-27 MED ORDER — TIZANIDINE HCL 4 MG PO TABS: 4.0000 mg | ORAL_TABLET | Freq: Every day | ORAL | 11 refills | Status: DC

## 2017-06-27 MED ORDER — ALENDRONATE SODIUM 70 MG PO TABS: 70.0000 mg | ORAL_TABLET | ORAL | 11 refills | Status: DC

## 2017-06-27 NOTE — Patient Instructions (Signed)
I will order a mammogram Walk every day that you are able I believe you can increase the gabapentin to improve your pain.  Alk to your pain doctor.  Need lab tests and a follow up with me in 6 months  Take the fosamax once a week Take as directed with full glass water This is for your bones  Make sure you take a multivitamin, iron and vitamin D every day

## 2017-06-27 NOTE — Progress Notes (Signed)
Chief Complaint  Patient presents with  . Annual Exam  emotional upset over 2 of her children she feels are "addicted to drugs" and not in contact with her. Prefers anxiety medicine, not happy with the cymbalta prescribed by psych Unhappy with a reduction in her oxycodone by her pain provider Had a DEXA in April, found in outside records.  She has "severe osteoporosis".  She has had vitamin deficiencies after her gastric surgery.  Is on vitamin D replacement now.  Will place on fosamax.  Instructions to prevent GI distressed are emphasized. hasn't had a mammo in years.  Discussed.  She would like to continue. She understands the recommendation is to stop at 10, but she is a young 40 and chooses to continue. Discussed her labs from May.   Mild increase in LFTs.  Will repeat.  She has macrocytosis.  No anemia.  No alcohol.  Likely due to nutrition   Patient Active Problem List   Diagnosis Date Noted  . Vitamin D deficiency 06/27/2017  . Osteoporosis 06/27/2017  . Fibromyalgia 03/30/2017  . History of medication noncompliance 03/30/2017  . Osteoporosis of lower leg associated with endocrine disorder 03/30/2017  . Primary osteoarthritis of left shoulder 04/11/2016  . Hearing difficulty 09/17/2015  . Neuropathic pain 04/10/2013  . Failed back syndrome 08/24/2012  . Pulmonary hypertension ([place]) 04/25/2012  . Congenital heart disease with intracardiac shunting 04/25/2012  . History of stroke 05/23/2011  . Status post total knee replacement 02/03/2010  . GERD (gastroesophageal reflux disease) 02/03/2010  . Chronic pain 02/02/2010  . Essential hypertension 01/15/2010  . Status post bariatric surgery 04/30/2009    Outpatient Encounter Prescriptions as of 06/27/2017  Medication Sig  . aspirin EC 81 MG tablet Take by mouth.  . calcium citrate-vitamin D 500-400 MG-UNIT chewable tablet Chew by mouth.  . DULoxetine (CYMBALTA) 30 MG capsule Take 1 capsule (30 mg total) by mouth daily.  Marland Kitchen  gabapentin (NEURONTIN) 100 MG capsule Take 100 mg by mouth 2 (two) times daily.  . hydrochlorothiazide (HYDRODIURIL) 25 MG tablet Take 1 tablet by mouth daily.  Marland Kitchen HYDROcodone-acetaminophen (NORCO) 10-325 MG tablet Take 1 tablet by mouth 3 (three) times daily.   . IRON PO Take by mouth.  . loratadine (CLARITIN) 10 MG tablet Take 10 mg by mouth daily.  Marland Kitchen omeprazole (PRILOSEC) 40 MG capsule Take 1 capsule by mouth daily.  . potassium chloride (KLOR-CON M10) 10 MEQ tablet Take 1 tablet (10 mEq total) by mouth daily.  Marland Kitchen tiZANidine (ZANAFLEX) 4 MG tablet Take 1 tablet (4 mg total) by mouth at bedtime.  . traMADol (ULTRAM) 50 MG tablet Take 1 tablet by mouth 3 (three) times daily.  . Vitamin D, Ergocalciferol, (DRISDOL) 50000 units CAPS capsule Take 50,000 Units by mouth every 7 (seven) days.  . [DISCONTINUED] tiZANidine (ZANAFLEX) 4 MG tablet Take 1 tablet (4 mg total) by mouth every 6 (six) hours as needed for muscle spasms.  . [DISCONTINUED] tiZANidine (ZANAFLEX) 4 MG tablet Take 1 tablet (4 mg total) by mouth at bedtime.  Marland Kitchen alendronate (FOSAMAX) 70 MG tablet Take 1 tablet (70 mg total) by mouth every 7 (seven) days. Take with a full glass of water on an empty stomach.  Marland Kitchen oxyCODONE-acetaminophen (PERCOCET) 7.5-325 MG tablet    No facility-administered encounter medications on file as of 06/27/2017.     Allergies  Allergen Reactions  . Codeine Nausea And Vomiting    Review of Systems  Constitutional: Negative for activity change, appetite change  and unexpected weight change.  HENT: Negative for congestion, dental problem, postnasal drip and rhinorrhea.   Eyes: Negative for redness and visual disturbance.  Respiratory: Negative for cough and shortness of breath.   Cardiovascular: Negative for chest pain, palpitations and leg swelling.  Gastrointestinal: Negative for abdominal pain, constipation and diarrhea.  Genitourinary: Negative for difficulty urinating and frequency.  Musculoskeletal:  Positive for back pain. Negative for arthralgias.       Muscle cramps  Neurological: Negative for dizziness and headaches.  Psychiatric/Behavioral: Positive for sleep disturbance. Negative for dysphoric mood. The patient is nervous/anxious.     BP 120/76 (BP Location: Left Arm, Patient Position: Sitting, Cuff Size: Normal)   Pulse 89   Temp (!) 96.9 F (36.1 C) (Other (Comment))   Resp 18   Ht 5' 4.5" (1.638 m)   Wt 141 lb 8 oz (64.2 kg)   SpO2 96%   BMI 23.91 kg/m   Physical Exam   BP 120/76 (BP Location: Left Arm, Patient Position: Sitting, Cuff Size: Normal)   Pulse 89   Temp (!) 96.9 F (36.1 C) (Other (Comment))   Resp 18   Ht 5' 4.5" (1.638 m)   Wt 141 lb 8 oz (64.2 kg)   SpO2 96%   BMI 23.91 kg/m   General Appearance:    Alert, cooperative, no distress, appears stated age  Head:    Normocephalic, without obvious abnormality, atraumatic.  Scars both temples from face lift  Eyes:    PERRL, conjunctiva/corneas clear, EOM's intact, fundi    benign, both eyes  Ears:    Normal TM's and external ear canals, both ears  Nose:   Nares normal, septum midline, mucosa normal, no drainage    or sinus tenderness  Throat:   Lips, mucosa, and tongue normal; teeth and gums normal  Neck:   Supple, symmetrical, trachea midline, no adenopathy;    thyroid:  no enlargement/tenderness/nodules; no carotid   bruit   Back:     Symmetric, no curvature, ROM normal, no CVA tenderness  Lungs:     Clear to auscultation bilaterally, respirations unlabored  Chest Wall:    No tenderness or deformity   Heart:    Regular rate and rhythm, S1 and S2 normal, no murmur, rub   or gallop  Breast Exam:    No tenderness, masses, or nipple abnormality  Abdomen:     Soft, non-tender, bowel sounds active all four quadrants,    no masses, no organomegaly  Extremities:   Extremities normal, atraumatic, no cyanosis or edema.  bilat scars knees from TKA  Pulses:   2+ and symmetric all extremities  Skin:   Skin  color, texture, turgor normal, no rashes or lesions  Lymph nodes:   Cervical, supraclavicular, and axillary nodes normal  Neurologic:   CNII-XII intact, normal strength, sensation and reflexes    throughout     ASSESSMENT/PLAN:  1. Vitamin D deficiency - VITAMIN D 25 Hydroxy (Vit-D Deficiency, Fractures)  2. Age-related osteoporosis without current pathological fracture  3. Gastroesophageal reflux disease, esophagitis presence not specified  4. Status post bariatric surgery - CBC - VITAMIN D 25 Hydroxy (Vit-D Deficiency, Fractures) - Vitamin B12 - Urinalysis, Routine w reflex microscopic - COMPLETE METABOLIC PANEL WITH GFR  5. Macrocytosis  6. Screening for breast cancer  Greater than 50% of this visit was spent in counseling and coordinating care.  Total face to face time:   40 min  Patient Instructions  I will order a mammogram Walk  every day that you are able I believe you can increase the gabapentin to improve your pain.  Alk to your pain doctor.  Need lab tests and a follow up with me in 6 months  Take the fosamax once a week Take as directed with full glass water This is for your bones  Make sure you take a multivitamin, iron and vitamin D every day   Raylene Everts, MD

## 2017-06-29 ENCOUNTER — Telehealth: Payer: Self-pay | Admitting: Family Medicine

## 2017-06-29 MED ORDER — HYDROCHLOROTHIAZIDE 25 MG PO TABS: 25.0000 mg | ORAL_TABLET | Freq: Every day | ORAL | 0 refills | Status: DC

## 2017-06-29 MED ORDER — OMEPRAZOLE 40 MG PO CPDR: 40.0000 mg | DELAYED_RELEASE_CAPSULE | Freq: Every day | ORAL | 0 refills | Status: DC

## 2017-06-29 NOTE — Telephone Encounter (Signed)
Patient requesting Rx refill please call in @ [facility]    omeprazole (PRILOSEC) 40 MG capsule  Hydrochlorothiazide (HYDRODIURIL)25mg 

## 2017-07-03 ENCOUNTER — Encounter (HOSPITAL_COMMUNITY): Payer: Self-pay | Admitting: Licensed Clinical Social Worker

## 2017-07-03 ENCOUNTER — Ambulatory Visit (INDEPENDENT_AMBULATORY_CARE_PROVIDER_SITE_OTHER): Payer: Medicare Other | Admitting: Licensed Clinical Social Worker

## 2017-07-03 DIAGNOSIS — F330 Major depressive disorder, recurrent, mild: Secondary | ICD-10-CM

## 2017-07-03 NOTE — Progress Notes (Signed)
   THERAPIST PROGRESS NOTE  Session Time: 10:00 am-11:00 am  Participation Level: Active  Behavioral Response: CasualAlertEuthymic  Type of Therapy: Individual Therapy  Treatment Goals addressed: Coping  Interventions: CBT and Solution Focused  Summary: [name] Rojas is a 76 y.o. female who presents  oriented x5 (person, place, situation, time and object), alert with periods of tearfulness, well groomed, euthymic, and cooperative to address mood and anxiety. Patient has a history of medical treatment including fibromyalgia, back surgeries, osteoporosis and back surgery. Patient has limited mental health treatment including medication management. Patient denies symptoms of mania. Patient denies suicidal and homicidal ideations. She denies symptoms of psychosis including auditory and visual hallucinations. Patient denies current or history of substance abuse. Patient is at no risk for lethality. Patient has a history of unhealthy relationships that include her family of origin, her former marriage and her relationship with two of her children. Patient has a history of feeling used and rejected in those relationships.  Patient had an average score of 9 out of 10 on the Session Rating Scale. Patient identified some positive things that happened between session including getting a Engineering geologistlibrary card and [facility]. Patient shared her feelings and concerns related to her medication and her relationship with her children. Patient identified that she needs to set healthy boundaries with her children and then keep herself active as well as helping others. Patient committed to set healthy boundaries with her children, get active and find opportunities in the community to serve others. Patient rated the session 10 out of 10 on the Session Rating Scale.  Patient engaged in session. She responded well to interventions. Patient continues to meet criteria for Major depressive disorder, recurrent  episode, mild with anxious distress. Patient will continue in individual outpatient therapy due to being the least restrictive service to meet her needs. Patient made minimal progress on her goals.   Suicidal/Homicidal: Negativewithout intent/plan  Therapist Response: Therapist reviewed patient's recent thoughts and behaviors. Therapist utilized CBT to address mood. Therapist had patient identify what has gone well for her. Therapist processed patient's feelings to identify triggers for mood including medication, family and wanting to be social. Therapist assisted patient in identify ways to improve mood and be more social. Therapist committed patient to set healthy boundaries with her children, get active/social and identify opportunities to serve others/volunteer in the community. Therapist administered the Outcome Rating Scale and the Session Rating Scale.  Plan: Return again in  3 weeks.           Therapist will review patient's goals on or before 10.02.2018.  Diagnosis: Axis I: ADHD, inattentive type and Major depressive disorder, recurrent episode, mild with anxious distress    Axis II: No diagnosis    Bynum BellowsJoshua Debra Calabretta, LCSW 07/03/2017

## 2017-07-05 ENCOUNTER — Encounter (HOSPITAL_COMMUNITY): Payer: Self-pay | Admitting: Emergency Medicine

## 2017-07-05 ENCOUNTER — Emergency Department (HOSPITAL_COMMUNITY)
Admission: EM | Admit: 2017-07-05 | Discharge: 2017-07-06 | Disposition: A | Discharge status: Home / Self Care | Payer: Medicare Other | Attending: Emergency Medicine | Admitting: Emergency Medicine

## 2017-07-05 DIAGNOSIS — Z8673 Personal history of transient ischemic attack (TIA), and cerebral infarction without residual deficits: Secondary | ICD-10-CM | POA: Diagnosis not present

## 2017-07-05 DIAGNOSIS — I10 Essential (primary) hypertension: Secondary | ICD-10-CM | POA: Insufficient documentation

## 2017-07-05 DIAGNOSIS — R42 Dizziness and giddiness: Secondary | ICD-10-CM | POA: Insufficient documentation

## 2017-07-05 DIAGNOSIS — Z79899 Other long term (current) drug therapy: Secondary | ICD-10-CM | POA: Insufficient documentation

## 2017-07-05 DIAGNOSIS — R404 Transient alteration of awareness: Secondary | ICD-10-CM | POA: Diagnosis not present

## 2017-07-05 DIAGNOSIS — M5489 Other dorsalgia: Secondary | ICD-10-CM | POA: Diagnosis not present

## 2017-07-05 DIAGNOSIS — L299 Pruritus, unspecified: Secondary | ICD-10-CM | POA: Diagnosis not present

## 2017-07-05 NOTE — ED Triage Notes (Signed)
Pt states she took tramadol for her back, then took percocet at 9 pm and her muscle relaxer at 10 pm.  Pt states she is now feeling dizzy and a little nauseated.

## 2017-07-06 ENCOUNTER — Ambulatory Visit (HOSPITAL_COMMUNITY)
Admission: RE | Admit: 2017-07-06 | Discharge: 2017-07-06 | Disposition: A | Discharge status: Home / Self Care | Payer: Medicare Other | Source: Ambulatory Visit | Attending: Family Medicine | Admitting: Family Medicine

## 2017-07-06 DIAGNOSIS — Z1231 Encounter for screening mammogram for malignant neoplasm of breast: Secondary | ICD-10-CM | POA: Diagnosis not present

## 2017-07-06 DIAGNOSIS — R42 Dizziness and giddiness: Secondary | ICD-10-CM | POA: Diagnosis not present

## 2017-07-06 LAB — URINALYSIS, ROUTINE W REFLEX MICROSCOPIC
BILIRUBIN URINE: NEGATIVE
Glucose, UA: NEGATIVE mg/dL
Hgb urine dipstick: NEGATIVE
KETONES UR: NEGATIVE mg/dL
Leukocytes, UA: NEGATIVE
NITRITE: NEGATIVE
Protein, ur: NEGATIVE mg/dL
SPECIFIC GRAVITY, URINE: 1.009 (ref 1.005–1.030)
pH: 6 (ref 5.0–8.0)

## 2017-07-06 LAB — I-STAT CHEM 8, ED
BUN: 21 mg/dL — ABNORMAL HIGH (ref 6–20)
CALCIUM ION: 1.07 mmol/L — AB (ref 1.15–1.40)
CHLORIDE: 98 mmol/L — AB (ref 101–111)
Creatinine, Ser: 0.7 mg/dL (ref 0.44–1.00)
Glucose, Bld: 99 mg/dL (ref 65–99)
HCT: 32 % — ABNORMAL LOW (ref 36.0–46.0)
Hemoglobin: 10.9 g/dL — ABNORMAL LOW (ref 12.0–15.0)
Potassium: 3.9 mmol/L (ref 3.5–5.1)
SODIUM: 138 mmol/L (ref 135–145)
TCO2: 28 mmol/L (ref 0–100)

## 2017-07-06 NOTE — ED Provider Notes (Signed)
[facility]-EMER[facility]ENCY DEPT Provider Note   CSN: [id number] Arrival date & time: 07/05/17  2329     History   Chief Complaint Chief Complaint  Patient presents with  . Dizziness    HPI [name] Rojas is a 76 y.o. female.  The history is provided by the patient.  Dizziness  Quality:  Lightheadedness Severity:  Moderate Onset quality:  Gradual Timing:  Constant Progression:  Unchanged Chronicity:  New Context: medication   Relieved by:  Nothing Worsened by:  Nothing Associated symptoms: no chest pain, no diarrhea, no syncope and no vomiting   pt reports she feel generalized weakness/dizziness that started earlier tonight after taking home meds She reports earlier in the day, she had typical migraine HA and took 2 excedrin and HA improved In the evening she had her chronic back pain/spasms and took tramadol around 1900, and then took home percocet around 2100.  After that she took her home "muscle relaxant" and then her symptoms of dizziness began No new HA No cp No abd pain No fever She has had mild dysuria No vomiting/diarrhea  She takes all of these meds for chronic back pain following previous surgeries  Past Medical History:  Diagnosis Date  . Anemia   . Anxiety   . Arthritis   . Chronic back pain   . Depression   . GERD (gastroesophageal reflux disease)   . Hypertension   . Osteoporosis   . Stroke [facility])     Patient Active Problem List   Diagnosis Date Noted  . Vitamin D deficiency 06/27/2017  . Osteoporosis 06/27/2017  . Fibromyalgia 03/30/2017  . History of medication noncompliance 03/30/2017  . Osteoporosis of lower leg associated with endocrine disorder 03/30/2017  . Primary osteoarthritis of left shoulder 04/11/2016  . Hearing difficulty 09/17/2015  . Neuropathic pain 04/10/2013  . Failed back syndrome 08/24/2012  . Pulmonary hypertension (HCC) 04/25/2012  . Congenital heart disease with intracardiac shunting 04/25/2012  . History of stroke 05/23/2011    . Status post total knee replacement 02/03/2010  . GERD (gastroesophageal reflux disease) 02/03/2010  . Chronic pain 02/02/2010  . Essential hypertension 01/15/2010  . Status post bariatric surgery 04/30/2009    Past Surgical History:  Procedure Laterality Date  . ABDOMINAL HYSTERECTOMY     bleeding  . BACK SURGERY    . GASTRIC BYPASS    . KNEE SURGERY    . TONSILLECTOMY      OB History    No data available       Home Medications    Prior to Admission medications   Medication Sig Start Date End Date Taking? Authorizing Provider  alendronate (FOSAMAX) 70 MG tablet Take 1 tablet (70 mg total) by mouth every 7 (seven) days. Take with a full glass of water on an empty stomach. 06/27/17   Eustace Moore, MD  aspirin EC 81 MG tablet Take by mouth.    [provider]  calcium citrate-vitamin D 500-400 MG-UNIT chewable tablet Chew by mouth. 03/22/17   [provider]  DULoxetine (CYMBALTA) 30 MG capsule Take 1 capsule (30 mg total) by mouth daily. 06/12/17   Neysa Hotter, MD  gabapentin (NEURONTIN) 100 MG capsule Take 100 mg by mouth 2 (two) times daily.    [provider]  hydrochlorothiazide (HYDRODIURIL) 25 MG tablet Take 1 tablet (25 mg total) by mouth daily. 06/29/17   Eustace Moore, MD  HYDROcodone-acetaminophen [facility]) 10-325 MG tablet Take 1 tablet by mouth 3 (three) times daily.  01/30/17  [provider]  IRON PO Take by mouth.    [provider]  loratadine (CLARITIN) 10 MG tablet Take 10 mg by mouth daily.    [provider]  omeprazole (PRILOSEC) 40 MG capsule Take 1 capsule (40 mg total) by mouth daily. 06/29/17   Eustace MooreNelson, Yvonne Sue, MD  oxyCODONE-acetaminophen (PERCOCET) 7.5-325 MG tablet  06/20/17   [provider]  potassium chloride (KLOR-CON M10) 10 MEQ tablet Take 1 tablet (10 mEq total) by mouth daily. 03/30/17   Eustace MooreNelson, Yvonne Sue, MD  tiZANidine (ZANAFLEX) 4 MG tablet Take 1 tablet (4 mg total) by  mouth at bedtime. 06/27/17   Eustace MooreNelson, Yvonne Sue, MD  traMADol (ULTRAM) 50 MG tablet Take 1 tablet by mouth 3 (three) times daily. 01/30/17   [provider]  Vitamin D, Ergocalciferol, (DRISDOL) 50000 units CAPS capsule Take 50,000 Units by mouth every 7 (seven) days.    [provider]    Family History Family History  Problem Relation Age of Onset  . Arthritis Mother   . Asthma Mother   . Heart disease Mother   . Early death Father        trauma    Social History Social History  Substance Use Topics  . Smoking status: Never Smoker  . Smokeless tobacco: Never Used  . Alcohol use No     Allergies   Codeine   Review of Systems Review of Systems  Constitutional: Negative for fever.  Respiratory: Negative for cough.   Cardiovascular: Negative for chest pain and syncope.  Gastrointestinal: Negative for diarrhea and vomiting.  Neurological: Positive for dizziness.  All other systems reviewed and are negative.    Physical Exam Updated Vital Signs BP (!) 114/96 (BP Location: Left Arm)   Pulse 71   Temp 98.6 F (37 C) (Oral)   Resp 16   Ht 1.638 m (5' 4.5")   Wt 63.5 kg (140 lb)   SpO2 97%   BMI 23.66 kg/m   Physical Exam CONSTITUTIONAL: elderly, no acute distress HEAD: Normocephalic/atraumatic EYES: EOMI/PERRL ENMT: Mucous membranes moist NECK: supple no meningeal signs SPINE/BACK:well healed midline scar, no focal bruising noted CV: S1/S2 noted, no murmurs/rubs/gallops noted LUNGS: Lungs are clear to auscultation bilaterally, no apparent distress ABDOMEN: soft, nontender, no rebound or guarding, bowel sounds noted throughout abdomen GU:no cva tenderness NEURO: Pt is awake/alert/appropriate, moves all extremitiesx4.  No facial droop.  No arm/leg drift.  Speech is clear.  No dysarthria EXTREMITIES: pulses normal/equal, full ROM SKIN: warm, color normal PSYCH: no abnormalities of mood noted, alert and oriented to situation   ED Treatments /  Results  Labs (all labs ordered are listed, but only abnormal results are displayed) Labs Reviewed  URINALYSIS, ROUTINE W REFLEX MICROSCOPIC - Abnormal; Notable for the following:       Result Value   Color, Urine STRAW (*)    All other components within normal limits  I-STAT CHEM 8, ED - Abnormal; Notable for the following:    Chloride 98 (*)    BUN 21 (*)    Calcium, Ion 1.07 (*)    Hemoglobin 10.9 (*)    HCT 32.0 (*)    All other components within normal limits    EKG  EKG Interpretation  Date/Time:  Wednesday July 05 2017 23:37:32 EDT Ventricular Rate:  68 PR Interval:    QRS Duration: 82 QT Interval:  386 QTC Calculation: 411 R Axis:   -4 Text Interpretation:  Sinus rhythm Abnormal R-wave progression, early  transition LVH by voltage Borderline T abnormalities, anterior leads No significant change since last tracing Confirmed by Zadie Rhine ([id number]) on 07/06/2017 12:14:19 AM       Radiology No results found.  Procedures Procedures (including critical care time)  Medications Ordered in ED Medications - No data to display   Initial Impression / Assessment and Plan / ED Course  I have reviewed the triage vital signs and the nursing notes.  Pertinent labs  results that were available during my care of the patient were reviewed by me and considered in my medical decision making (see chart for details).     12:22 AM Pt awake/alert, no distress No focal neuro deficits Suspect this is due to overmedication Will monitor and reassess 2:44 AM PT IMPROVED SHE IS AMBULATORY NO FOCAL WEAKNESS BP 131/90   Pulse 67   Temp 98.6 F (37 C) (Oral)   Resp 12   Ht 1.638 m (5' 4.5")   Wt 63.5 kg (140 lb)   SpO2 99%   BMI 23.66 kg/m  SHE REPORTS ALL SYMPTOMS STARTED AFTER TAKING MUSCLE RELAXANT ADVISED IF SHE IS TAKING TRAMADOL/PERCOCET SHE SHOULD AVOID MUSCLE RELAXANT SHE NEEDS CLOSE F/U FOR MEDICATION MANAGEMENT BY HER PAIN SPECIALIST SUSPECT THIS IS DUE TO  OVERMEDICATION NO SIGNS OF STROKE Final Clinical Impressions(s) / ED Diagnoses   Final diagnoses:  Dizziness    New Prescriptions New Prescriptions   No medications on file     Zadie Rhine, MD 07/06/17 0246

## 2017-07-06 NOTE — ED Notes (Signed)
Pt ambulated with no assistance.

## 2017-07-06 NOTE — Discharge Instructions (Signed)
AS WE DISCUSSED, PLEASE CALL DR DOONQUAH FOR CLOSE FOLLOWUP AND MEDICATION MANAGEMENT IF YOU TAKE TRAMADOL AND PERCOCET, PLEASE AVOID MUSCLE RELAXANT   Your caregiver has seen you today because you are having problems with feelings of weakness, dizziness, and/or fatigue. Weakness has many different causes, some of which are common and others are very rare. Your caregiver has considered some of the most common causes of weakness and feels it is safe for you to go home and be observed. Not every illness or injury can be identified during an emergency department visit, thus follow-up with your primary healthcare provider is important. Medical conditions can also worsen, so it is also important to return immediately as directed below, or if you have other serious concerns develop.  RETURN IMMEDIATELY IF  you develop new shortness of breath, chest pain, fever, have difficulty moving parts of your body (new weakness, numbness, or incoordination), sudden change in speech, vision, swallowing, or understanding, faint or develop new dizziness, severe headache, become poorly responsive or have an altered mental status compared to baseline for you, new rash, abdominal pain, or bloody stools,  Return sooner also if you develop new problems for which you have not talked to your caregiver but you feel may be emergency medical conditions.

## 2017-07-07 ENCOUNTER — Other Ambulatory Visit: Payer: Self-pay | Admitting: Family Medicine

## 2017-07-07 DIAGNOSIS — N6310 Unspecified lump in the right breast, unspecified quadrant: Secondary | ICD-10-CM

## 2017-07-07 DIAGNOSIS — N6320 Unspecified lump in the left breast, unspecified quadrant: Secondary | ICD-10-CM

## 2017-07-10 ENCOUNTER — Other Ambulatory Visit: Payer: Self-pay | Admitting: Family Medicine

## 2017-07-10 DIAGNOSIS — R928 Other abnormal and inconclusive findings on diagnostic imaging of breast: Secondary | ICD-10-CM

## 2017-07-11 NOTE — Progress Notes (Signed)
[facility] MD/PA/NP OP Progress Note  07/12/2017 8:46 AM [name] Rojas  MRN:  [id number]  Chief Complaint:  Chief Complaint    Depression; Follow-up     Subjective:  "I want Valium" HPI:  - Patient was evaluated at ED for dizziness in the setting of taking muscle relaxant, TRAMADOL/PERCOCET  Patient presents for follow up appointment for depression. She states that she continues to feel nervous and depressed. She wants V[place]um to be prescribed, as she knows that it worked well in the past. She is unsure if duloxetine works for her, although she denies any side effect. 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 She endorses difficulty in concentration/inattention; she is unable to complete tasks at home. She denies insomnia. She feels anxious, but denies panic attacks. She denies SI, HI, AH/VH. ADL/IADL full.   Per [employer] Patient was prescribed tramadol by two providers  Visit Diagnosis:    ICD-10-CM   1. Major depressive disorder, recurrent episode, moderate (HCC) F33.1   2. Anxiety disorder, unspecified type F41.9   3. Mild neurocognitive disorder G31.84     Past Psychiatric History:  I have reviewed the patient's psychiatry history in detail and updated the patient record. Outpatient: in 2013 Psychiatry admission: denies Previous suicide attempt: denies Past trials of medication: sertraline, Effexor, Wellbutrin, buspar,  History of violence: denies  Past Medical History:  Past Medical History:  Diagnosis Date  . Anemia   . Anxiety   . Arthritis   . Chronic back pain   . Depression   . GERD (gastroesophageal reflux disease)   . Hypertension   . Osteoporosis   . Stroke [facility])     Past Surgical History:  Procedure Laterality Date  . ABDOMINAL HYSTERECTOMY     bleeding  . BACK SURGERY    . GASTRIC BYPASS    . KNEE SURGERY    . TONSILLECTOMY      Family Psychiatric History:  I have reviewed the patient's family history in  detail and updated the patient record. Denies, also denies family history of memory disorder  Family History:  Family History  Problem Relation Age of Onset  . Arthritis Mother   . Asthma Mother   . Heart disease Mother   . Early death Father        trauma    Social History:  Social History   Social History  . Marital status: Widowed    Spouse name: N/A  . Number of children: N/A  . Years of education: N/A   Social History Main Topics  . Smoking status: Never Smoker  . Smokeless tobacco: Never Used  . Alcohol use No  . Drug use: No  . Sexual activity: Not Currently   Other Topics Concern  . Not on file   Social History Narrative  . No narrative on file    Allergies:  Allergies  Allergen Reactions  . Codeine Nausea And Vomiting    Metabolic Disorder Labs: No results found for: HGBA1C, MPG No results found for: PROLACTIN Lab Results  Component Value Date   CHOL 150 04/24/2017   TRIG 55 04/24/2017   HDL 69 04/24/2017   CHOLHDL 2.2 04/24/2017   VLDL 11 04/24/2017   LDLCALC 70 04/24/2017     Current Medications: Current Outpatient Prescriptions  Medication Sig Dispense Refill  . alendronate (FOSAMAX) 70 MG tablet Take 1 tablet (70 mg total) by mouth every 7 (seven) days. Take with a full glass  of water on an empty stomach. 4 tablet 11  . aspirin EC 81 MG tablet Take by mouth.    Marland Kitchen CALCIUM CITRATE-VITAMIN D PO Take by mouth.    . gabapentin (NEURONTIN) 100 MG capsule Take 100 mg by mouth 2 (two) times daily.    . hydrochlorothiazide (HYDRODIURIL) 25 MG tablet Take 1 tablet (25 mg total) by mouth daily. 90 tablet 0  . IRON PO Take by mouth.    . loratadine (CLARITIN) 10 MG tablet Take 10 mg by mouth daily.    Marland Kitchen omeprazole (PRILOSEC) 40 MG capsule Take 1 capsule (40 mg total) by mouth daily. 90 capsule 0  . oxyCODONE-acetaminophen (PERCOCET) 7.5-325 MG tablet     . potassium chloride (KLOR-CON M10) 10 MEQ tablet Take 1 tablet (10 mEq total) by mouth daily.  90 tablet 3  . traMADol (ULTRAM) 50 MG tablet Take 1 tablet by mouth 3 (three) times daily.    . DULoxetine (CYMBALTA) 20 MG capsule Take 2 capsules (40 mg total) by mouth daily. 60 capsule 1  . HYDROcodone-acetaminophen (NORCO) 10-325 MG tablet Take 1 tablet by mouth 3 (three) times daily.     Marland Kitchen tiZANidine (ZANAFLEX) 4 MG tablet Take 1 tablet (4 mg total) by mouth at bedtime. (Patient not taking: Reported on 07/12/2017) 30 tablet 11   No current facility-administered medications for this visit.     Neurologic: Headache: No Seizure: No Paresthesias: No  Musculoskeletal: Strength & Muscle Tone: within normal limits Gait & Station: normal Patient leans: N/A  Psychiatric Specialty Exam: Review of Systems  Musculoskeletal: Positive for back pain.  Psychiatric/Behavioral: Positive for depression. Negative for hallucinations, substance abuse and suicidal ideas. The patient is nervous/anxious and has insomnia.   All other systems reviewed and are negative.   Blood pressure 130/83, pulse 93, height 5' 4.5" (1.638 m), weight 134 lb 9.6 oz (61.1 kg).Body mass index is 22.75 kg/m.  General Appearance: Casual and Fairly Groomed  Eye Contact:  Fair  Speech:  Clear and Coherent  Volume:  Normal  Mood:  Depressed  Affect:  slightly restricted  Thought Process:  Coherent and Goal Directed  Orientation:  Full (Time, Place, and Person)  Thought Content: Logical Perceptions: denies AH/VH  Suicidal Thoughts:  No  Homicidal Thoughts:  No  Memory:  Immediate;   Fair Recent;   Fair Remote;   Poor  Judgement:  Fair  Insight:  Fair  Psychomotor Activity:  Normal  Concentration:  Concentration: Good and Attention Span: Good  Recall:  Good  Fund of Knowledge: Good  Language: Good  Akathisia:  No  Handed:  Right  AIMS (if indicated):  N/A  Assets:  Communication Skills Desire for Improvement  ADL's:  Intact  Cognition: WNL  Sleep:  good  MOCA 20/30 on 07/12/2017 (-4 for  visuospatial/executive, -1 for naming, -5 for delayed recall)  Assessment Stephanie Rawe is a 76 y.o. year old female with a history of depression, anxiety, fibromyalgia, hypertension, pulmonary hypertension, h/o stroke, GERD, s/p bariatric surgery, who presents for follow up appointment for Major depressive disorder, recurrent episode, moderate (HCC)  Anxiety disorder, unspecified type  Mild neurocognitive disorder  # MDD, moderate, recurrent without psychotic features # Unspecified anxiety disorder Patient continues to endorse neurovegetative symptoms and anxiety, although she seems to be more engaging in activity (i.e. Starting a volunteer work). Will uptitrate duloxetine to target mood symtoms and pain. Will do slow uptitration given history of adverse effect from Effexor at a higher dose. Discussed in  length again regarding a rationale of not prescribing benzodiazepine; risk of falls and oversedation. She agrees with plans.   # Mild neurocognitive disorder MOCA with neurocognitive impairment as above, although she denies any concern about ADL/IADL. It may be more likely that she has underlying cognitive disorder on top of mood symptoms, although depression can certainly impact her cognition. Will consider aricept at the next encounter.   Plan 1. Increase duloxetine 40 mg daily 2. Return to clinic in one month for 30 mins 3. Patient agrees not to start benzodiazepine 4. Consider checking vitamin B12, folate at the next encounter  The patient demonstrates the following risk factors for suicide: Chronic risk factors for suicide include: psychiatric disorder of depression, anxiety and chronic pain. Acute risk factors for suicide include: family or marital conflict. Protective factors for this patient include: positive social support, coping skills and hope for the future. Considering these factors, the overall suicide risk at this point appears to be low. Patient is appropriate for outpatient  follow up.  Treatment Plan Summary:Plan as above  The duration of this appointment visit was 30 minutes of face-to-face time with the patient.  Greater than 50% of this time was spent in counseling, explanation of  diagnosis, planning of further management, and coordination of care.  Neysa Hottereina Saylor Murry, MD 07/12/2017, 8:46 AM

## 2017-07-12 ENCOUNTER — Ambulatory Visit (INDEPENDENT_AMBULATORY_CARE_PROVIDER_SITE_OTHER): Payer: Medicare Other | Admitting: Psychiatry

## 2017-07-12 VITALS — BP 130/83 | HR 93 | Ht 64.5 in | Wt 134.6 lb

## 2017-07-12 DIAGNOSIS — M797 Fibromyalgia: Secondary | ICD-10-CM | POA: Diagnosis not present

## 2017-07-12 DIAGNOSIS — Z9884 Bariatric surgery status: Secondary | ICD-10-CM

## 2017-07-12 DIAGNOSIS — F331 Major depressive disorder, recurrent, moderate: Secondary | ICD-10-CM

## 2017-07-12 DIAGNOSIS — K219 Gastro-esophageal reflux disease without esophagitis: Secondary | ICD-10-CM | POA: Diagnosis not present

## 2017-07-12 DIAGNOSIS — F419 Anxiety disorder, unspecified: Secondary | ICD-10-CM

## 2017-07-12 DIAGNOSIS — G4700 Insomnia, unspecified: Secondary | ICD-10-CM | POA: Diagnosis not present

## 2017-07-12 DIAGNOSIS — G3184 Mild cognitive impairment, so stated: Secondary | ICD-10-CM | POA: Diagnosis not present

## 2017-07-12 DIAGNOSIS — I272 Pulmonary hypertension, unspecified: Secondary | ICD-10-CM | POA: Diagnosis not present

## 2017-07-12 MED ORDER — DULOXETINE HCL 20 MG PO CPEP: 40.0000 mg | ORAL_CAPSULE | Freq: Every day | ORAL | 1 refills | Status: DC

## 2017-07-12 NOTE — Patient Instructions (Addendum)
1. Increase duloxetine 40 mg daily 2. Return to clinic in one month for 30 mins 

## 2017-07-24 DIAGNOSIS — M25519 Pain in unspecified shoulder: Secondary | ICD-10-CM | POA: Diagnosis not present

## 2017-07-24 DIAGNOSIS — M545 Low back pain: Secondary | ICD-10-CM | POA: Diagnosis not present

## 2017-07-24 DIAGNOSIS — M797 Fibromyalgia: Secondary | ICD-10-CM | POA: Diagnosis not present

## 2017-07-24 DIAGNOSIS — M25569 Pain in unspecified knee: Secondary | ICD-10-CM | POA: Diagnosis not present

## 2017-07-24 DIAGNOSIS — Z79891 Long term (current) use of opiate analgesic: Secondary | ICD-10-CM | POA: Diagnosis not present

## 2017-07-25 ENCOUNTER — Encounter (HOSPITAL_COMMUNITY): Payer: Self-pay | Admitting: Licensed Clinical Social Worker

## 2017-07-25 ENCOUNTER — Ambulatory Visit (INDEPENDENT_AMBULATORY_CARE_PROVIDER_SITE_OTHER): Payer: Medicare Other | Admitting: Licensed Clinical Social Worker

## 2017-07-25 DIAGNOSIS — F330 Major depressive disorder, recurrent, mild: Secondary | ICD-10-CM

## 2017-07-25 NOTE — Progress Notes (Signed)
THERAPIST PROGRESS NOTE  Session Time: 10:00 am-10:45 am  Participation Level: Active  Behavioral Response: CasualAlertEuthymic  Type of Therapy: Individual Therapy  Treatment Goals addressed: Coping  Interventions: CBT and Solution Focused  Summary: [name] Rojas is a 76 y.o. female who presents  oriented x5 (person, place, situation, time and object), alert with periods of tearfulness, well groomed, euthymic, and cooperative to address mood and anxiety. Patient has a history of medical treatment including fibromyalgia, back surgeries, osteoporosis and back surgery. Patient has limited mental health treatment including medication management. Patient denies symptoms of mania. Patient denies suicidal and homicidal ideations. She denies symptoms of psychosis including auditory and visual hallucinations. Patient denies current or history of substance abuse. Patient is at no risk for lethality. Patient has a history of unhealthy relationships that include her family of origin, her former marriage and her relationship with two of her children. Patient has a history of feeling used and rejected in those relationships.  Patient had an average score of 7.25 out of 10 on the Session Rating Scale which is a decrease of 1.25 since the last session. Patient identified some positive things that happened between session including going back to church, talking to her daughter that didn't want to speak to her and spending time with her grandchildren. Patient reported that she has been feeling depressed about all of the medication that she has been on. She feels like she takes a pill, has a side effect and then gets a pill to treat the side effect. Patient stated that she has started to come off some of her medications and wants to only take her pain medication, blood pressure medication and vitamins. After discussion, patient understood that she has a right to have input into her medical treatment but needs to  inform her providers that she is going to stop her medications. Patient reported that she wants to live life rather than be dependent on medication but understood that her medication helps her be able to live her life. Patient reports she continues to go to the senior center to exercise, serve at church and is going to start serving the community by working in [employer]. Patient reported that she spoke to her daughter but doesn't feel like things are resolved. She plans to wait for God to direct her on when to speak to her daughter again because she doesn't want it to see like she is trying to get into her daughter's business. Patient committed to continue to be active and to consult with her providers before stopping medications. Patient rated the session 10 out of 10 on the Session Rating Scale.  Patient engaged in session. She responded well to interventions. Patient continues to meet criteria for Major depressive disorder, recurrent episode, mild with anxious distress. Patient will continue in individual outpatient therapy due to being the least restrictive service to meet her needs. Patient made moderate progress on her goals.   Suicidal/Homicidal: Negativewithout intent/plan  Therapist Response: Therapist reviewed patient's recent thoughts and behaviors. Therapist utilized CBT to address mood. Therapist had patient identify what has gone well for her. Therapist processed patient's feelings about her medication to identify triggers. Therapist instructed patient to consult with her providers before stopping medication. Therapist processed patient's phone call with her daughter who has not been speaking to her. Therapist committed patient to continue to stay active and consult with her providers before stopping medication. Therapist administered the Outcome Rating Scale and the Session Rating Scale.  Plan: Return  again in  4 weeks.           Therapist will review patient's goals on or before  10.02.2018.  Diagnosis: Axis I: ADHD, inattentive type and Major depressive disorder, recurrent episode, mild with anxious distress    Axis II: No diagnosis    Bynum Bellows, LCSW 07/25/2017

## 2017-08-01 ENCOUNTER — Ambulatory Visit (INDEPENDENT_AMBULATORY_CARE_PROVIDER_SITE_OTHER): Payer: Medicare Other | Admitting: Family Medicine

## 2017-08-01 ENCOUNTER — Encounter: Payer: Self-pay | Admitting: Family Medicine

## 2017-08-01 VITALS — BP 122/84 | HR 100 | Temp 97.4°F | Resp 18 | Ht 64.5 in | Wt 141.1 lb

## 2017-08-01 DIAGNOSIS — M961 Postlaminectomy syndrome, not elsewhere classified: Secondary | ICD-10-CM | POA: Diagnosis not present

## 2017-08-01 DIAGNOSIS — G894 Chronic pain syndrome: Secondary | ICD-10-CM | POA: Diagnosis not present

## 2017-08-01 DIAGNOSIS — R413 Other amnesia: Secondary | ICD-10-CM | POA: Diagnosis not present

## 2017-08-01 NOTE — Patient Instructions (Signed)
Stop tramadol Stop the other medicine I marked for you (old prescriptions)  You may increase the gabapentin You may take up to 3 pills three times a day This is for nerve pain  Talk to your pain doctor about pain management You might need to go back to a spine specialist

## 2017-08-01 NOTE — Progress Notes (Signed)
Chief Complaint  Patient presents with  . Memory Loss  Patient is here complaining of memory loss She thinks she is on too much medication. Her family wants to get her off of the narcotic medications. She wants to go off of some of her medicines but not Percocet. She complains of chronic unremitting low back pain and feels Percocet is the only thing that helps her. In spite of taking Percocet 7.5 4 times a day she is also given tramadol 50 mg 120 month. She states that she has been having trouble getting out of bed in the morning and more drowsy. I have advised her to stop the tramadol and perhaps we can maximize other nonnarcotic alternatives. The patient is having trouble remembering her medications. She brings in a zip lock bag full of pill bottles prescribed and over-the-counter with additional pill bottles loose in her purse. She's not sure what she's taking or not. There are old prescriptions from 2017 that have been discontinued, and that she did not tolerate. I went through all of her medications and wrote on the pill bottle what they are for. I put the discontinued medications into a separate bag and hand at those to her daughter-in-law for disposal. We discussed that she needs to have her pharmacy changed and go to one that put all the pills and a blister pack for ease of remembering. She does occasionally run out of her Percocet because she takes too much. She still lives independently. She still drives. She has not lost. She hasn't fallen. Her family has noticed she repeats herself.  Patient Active Problem List   Diagnosis Date Noted  . Major depressive disorder, recurrent episode, moderate (HCC) 07/12/2017  . Mild neurocognitive disorder 07/12/2017  . Vitamin D deficiency 06/27/2017  . Osteoporosis 06/27/2017  . Fibromyalgia 03/30/2017  . History of medication noncompliance 03/30/2017  . Osteoporosis of lower leg associated with endocrine disorder 03/30/2017  . Primary  osteoarthritis of left shoulder 04/11/2016  . Hearing difficulty 09/17/2015  . Neuropathic pain 04/10/2013  . Anxiety disorder 04/10/2013  . Failed back syndrome 08/24/2012  . Pulmonary hypertension (HCC) 04/25/2012  . Congenital heart disease with intracardiac shunting 04/25/2012  . History of stroke 05/23/2011  . Status post total knee replacement 02/03/2010  . GERD (gastroesophageal reflux disease) 02/03/2010  . Chronic pain 02/02/2010  . Essential hypertension 01/15/2010  . Status post bariatric surgery 04/30/2009    Outpatient Encounter Prescriptions as of 08/01/2017  Medication Sig  . alendronate (FOSAMAX) 70 MG tablet Take 1 tablet (70 mg total) by mouth every 7 (seven) days. Take with a full glass of water on an empty stomach.  Marland Kitchen. aspirin EC 81 MG tablet Take by mouth.  Marland Kitchen. CALCIUM CITRATE-VITAMIN D PO Take by mouth.  . gabapentin (NEURONTIN) 100 MG capsule Take 300 mg by mouth 3 (three) times daily.   . hydrochlorothiazide (HYDRODIURIL) 25 MG tablet Take 1 tablet (25 mg total) by mouth daily.  . IRON PO Take by mouth.  . loratadine (CLARITIN) 10 MG tablet Take 10 mg by mouth daily.  Marland Kitchen. omeprazole (PRILOSEC) 40 MG capsule Take 1 capsule (40 mg total) by mouth daily.  Marland Kitchen. oxyCODONE-acetaminophen (PERCOCET) 7.5-325 MG tablet Take 1 tablet by mouth every 6 (six) hours as needed.   . potassium chloride (KLOR-CON M10) 10 MEQ tablet Take 1 tablet (10 mEq total) by mouth daily.  Marland Kitchen. tiZANidine (ZANAFLEX) 4 MG tablet Take 1 tablet (4 mg total) by mouth at bedtime.  .Marland Kitchen  DULoxetine (CYMBALTA) 30 MG capsule   . hydrOXYzine (ATARAX/VISTARIL) 25 MG tablet    No facility-administered encounter medications on file as of 08/01/2017.     Allergies  Allergen Reactions  . Codeine Nausea And Vomiting    Review of Systems  Constitutional: Negative for activity change, appetite change and unexpected weight change.  HENT: Negative for congestion, dental problem, postnasal drip and rhinorrhea.   Eyes:  Negative for redness and visual disturbance.  Respiratory: Negative for cough and shortness of breath.   Cardiovascular: Negative for chest pain, palpitations and leg swelling.  Gastrointestinal: Negative for abdominal pain, constipation and diarrhea.  Genitourinary: Negative for difficulty urinating and frequency.  Musculoskeletal: Positive for back pain. Negative for arthralgias.       Muscle cramps  Neurological: Negative for dizziness and headaches.  Psychiatric/Behavioral: Positive for confusion and sleep disturbance. Negative for dysphoric mood. The patient is nervous/anxious.     BP 122/84 (BP Location: Right Arm, Patient Position: Sitting, Cuff Size: Normal)   Pulse 100   Temp (!) 97.4 F (36.3 C) (Temporal)   Resp 18   Ht 5' 4.5" (1.638 m)   Wt 141 lb 1.3 oz (64 kg)   SpO2 96%   BMI 23.84 kg/m   Physical Exam  Constitutional: She is oriented to person, place, and time. She appears well-developed and well-nourished. No distress.  Cardiovascular: Normal rate, regular rhythm and normal heart sounds.   Pulmonary/Chest: Effort normal and breath sounds normal.  Musculoskeletal: Normal range of motion.  Strength and sensation, range of motion reflexes intact in all 4 extremities. Patient appears comfortable.  Neurological: She is alert and oriented to person, place, and time. She displays normal reflexes. She exhibits normal muscle tone. Coordination normal.  Skin: Skin is warm and dry. No rash noted.  Psychiatric: She has a normal mood and affect. Her behavior is normal. Thought content normal.  Alert. Lucid.    ASSESSMENT/PLAN:  1. Chronic pain syndrome Under care of pain management. I'm.going to contact them. Going to discontinue her tramadol. I think they can increase her gabapentin. Refer for spine consultation 2. Memory loss or impairment Epley patient's overmedicated. She also has some stress and anxiety. We'll do an memory test when she is calm her and has reduced  her medication. We'll also change her pharmacy to 1 that willorganize her pills.  Greater than 50% of this visit was spent in counseling and coordinating care.  Total face to face time:   45 minutes spent discussing with patient and her family her medication use, her chronic pain, and organizing her pill bottles Patient Instructions  Stop tramadol Stop the other medicine I marked for you (old prescriptions)  You may increase the gabapentin You may take up to 3 pills three times a day This is for nerve pain  Talk to your pain doctor about pain management You might need to go back to a spine specialist     Eustace Moore, MD

## 2017-08-02 ENCOUNTER — Other Ambulatory Visit: Payer: Self-pay

## 2017-08-02 ENCOUNTER — Telehealth: Payer: Self-pay

## 2017-08-02 NOTE — Progress Notes (Signed)
[facility] MD/PA/NP OP Progress Note  08/08/2017 8:45 AM [name] Rojas  MRN:  [id number]  Chief Complaint:  Chief Complaint    Follow-up; Depression     Subjective:  "I try to leave it alone" HPI:  - Patient complains of memory loss at her PCP visit. She is hoping to avoid polypharmacy.   Patient presents for follow up appointment for depression. She states that she feels better since the last appointment. She believes that her daughter is "turning around" and she hopes to "leave it alone" about her son. She told him that she loves him, although he does not text her back. She believes that things will be improved and she pray for God. She enjoys going to senior center and signed up for dancing class. She hopes to do some volunteer as well. Her pain is better controlled with oxycodone, and she has not taken tramadol.   She has occasional insomnia. She denies anhedonia. She has improved energy. She denies SI, HI, AH, VH. She feels less anxious and denies panic attacks. She continues to have memory loss. She tries to complete things one at a time so that she would not forget.   Tsh: wnl,   Per [employer] She is prescribed on oxycodone, tramadol  Visit Diagnosis:    ICD-10-CM   1. Major depressive disorder, recurrent episode, moderate (HCC) F33.1   2. Mild neurocognitive disorder G31.84 B12    Folate    RPR    Past Psychiatric History:  I have reviewed the patient's psychiatry history in detail and updated the patient record.  Outpatient: in 2013 Psychiatry admission: denies Previous suicide attempt: denies Past trials of medication: sertraline, Effexor, Wellbutrin, Buspar,  History of violence: denies  Past Medical History:  Past Medical History:  Diagnosis Date  . Anemia   . Anxiety   . Arthritis   . Chronic back pain   . Depression   . GERD (gastroesophageal reflux disease)   . Hypertension   . Osteoporosis   . Stroke [facility])     Past Surgical History:  Procedure Laterality  Date  . ABDOMINAL HYSTERECTOMY     bleeding  . BACK SURGERY    . GASTRIC BYPASS    . KNEE SURGERY    . TONSILLECTOMY      Family Psychiatric History:  I have reviewed the patient's family history in detail and updated the patient record. Denies, also denies family history of memory disorder  Family History:  Family History  Problem Relation Age of Onset  . Arthritis Mother   . Asthma Mother   . Heart disease Mother   . Early death Father        trauma    Social History:  Social History   Social History  . Marital status: Widowed    Spouse name: N/A  . Number of children: N/A  . Years of education: N/A   Social History Main Topics  . Smoking status: Never Smoker  . Smokeless tobacco: Never Used  . Alcohol use No  . Drug use: No  . Sexual activity: Not Currently   Other Topics Concern  . None   Social History Narrative  . None    Allergies:  Allergies  Allergen Reactions  . Codeine Nausea And Vomiting    Metabolic Disorder Labs: No results found for: HGBA1C, MPG No results found for: PROLACTIN Lab Results  Component Value Date   CHOL 150 04/24/2017   TRIG 55 04/24/2017   HDL 69 04/24/2017  CHOLHDL 2.2 04/24/2017   VLDL 11 04/24/2017   LDLCALC 70 04/24/2017     Current Medications: Current Outpatient Prescriptions  Medication Sig Dispense Refill  . alendronate (FOSAMAX) 70 MG tablet Take 1 tablet (70 mg total) by mouth every 7 (seven) days. Take with a full glass of water on an empty stomach. 12 tablet 3  . aspirin EC 81 MG tablet Take 1 tablet (81 mg total) by mouth daily. 90 tablet 3  . CALCIUM CITRATE-VITAMIN D PO Take by mouth.    . calcium-vitamin D (OSCAL WITH D) 500-200 MG-UNIT tablet Take 1 tablet by mouth daily with breakfast. 90 tablet 3  . gabapentin (NEURONTIN) 100 MG capsule Take 300 mg by mouth 3 (three) times daily.     . hydrochlorothiazide (HYDRODIURIL) 25 MG tablet Take 1 tablet (25 mg total) by mouth daily. 90 tablet 3  .  hydrOXYzine (ATARAX/VISTARIL) 25 MG tablet     . IRON PO Take by mouth.    . loratadine (CLARITIN) 10 MG tablet Take 10 mg by mouth daily.    Marland Kitchen. omeprazole (PRILOSEC) 40 MG capsule Take 1 capsule (40 mg total) by mouth daily. 90 capsule 0  . oxyCODONE-acetaminophen (PERCOCET) 7.5-325 MG tablet Take 1 tablet by mouth every 6 (six) hours as needed.     . potassium chloride (KLOR-CON M10) 10 MEQ tablet Take 1 tablet (10 mEq total) by mouth daily. 90 tablet 3  . tiZANidine (ZANAFLEX) 4 MG tablet Take 1 tablet (4 mg total) by mouth at bedtime. 30 tablet 11  . DULoxetine (CYMBALTA) 20 MG capsule Take 2 capsules (40 mg total) by mouth daily. 60 capsule 1   No current facility-administered medications for this visit.     Neurologic: Headache: No Seizure: No Paresthesias: No  Musculoskeletal: Strength & Muscle Tone: within normal limits Gait & Station: normal Patient leans: N/A  Psychiatric Specialty Exam: Review of Systems  Gastrointestinal: Positive for nausea.  Musculoskeletal: Positive for back pain.  Psychiatric/Behavioral: Positive for memory loss. Negative for depression, hallucinations, substance abuse and suicidal ideas. The patient is nervous/anxious and has insomnia.   All other systems reviewed and are negative.   Blood pressure 133/77, pulse 85, height 5' 4.5" (1.638 m), weight 138 lb 6.4 oz (62.8 kg).Body mass index is 23.39 kg/m.  General Appearance: Fairly Groomed  Eye Contact:  Good  Speech:  Clear and Coherent  Volume:  Normal  Mood:  "good"  Affect:  Appropriate and Congruent  Thought Process:  Coherent and Goal Directed  Orientation:  Full (Time, Place, and Person)  Thought Content: Logical Perceptions: denies AH/VH  Suicidal Thoughts:  No  Homicidal Thoughts:  No  Memory:  Immediate;   Fair Recent;   Fair Remote;   Fair  Judgement:  Good  Insight:  Fair  Psychomotor Activity:  Normal  Concentration:  Concentration: Good and Attention Span: Good  Recall:   Fair  Fund of Knowledge: Good  Language: Good  Akathisia:  No  Handed:  Right  AIMS (if indicated):  N/A  Assets:  Communication Skills Desire for Improvement  ADL's:  Intact  Cognition: WNL  Sleep:  Fair to poor   MOCA 20/30 on 07/12/2017 (-4 for visuospatial/executive, -1 for naming, -5 for delayed recall)  Assessment [name] Rojas is a 76 y.o. year old female with a history of depression, fibromyalgia, hypertension, pulmonary hypertension, h/o stroke, GERD, s/p bariatric surgery, who presents for follow up appointment for Major depressive disorder, recurrent episode, moderate (HCC)  Mild neurocognitive  disorder - Plan: B12, Folate, RPR  # MDD, moderate, recurrent without psychotic features # Unspecified anxiety disorder Exam is notable for her improved affect, and patient reports improvement in neurovegetative symptoms and anxiety since uptitration of duloxetine. Will continue current dose to target mood and pain. Discussed behavioral activation.   # Mild neurocognitive disorder She does mild neurocognitive deficits as evidenced in MOCA. ADL/IADL independent per report. Given she appears to cope well with her memory loss, will not start aricept at this time, although this option has been discussed. Will obtain labs to rule out treatable cause of dementia (TSH wnl). Will continue to monitor with MOCA as needed.   Plan 1. Continue duloxetine 40 mg daily 2. Return to clinic in one month  3. Check RPR, vitamin B12, folate - She will continue to see Mr. Sheets for therapy  Treatment Plan Summary:Plan as above  The duration of this appointment visit was 30 minutes of face-to-face time with the patient.  Greater than 50% of this time was spent in counseling, explanation of  diagnosis, planning of further management, and coordination of care.  Neysa Hotter, MD 08/08/2017, 8:45 AM

## 2017-08-02 NOTE — Telephone Encounter (Signed)
Do you want to add any meds to be blister packed?

## 2017-08-03 MED ORDER — DULOXETINE HCL 30 MG PO CPEP: 30.0000 mg | ORAL_CAPSULE | Freq: Every day | ORAL | 3 refills | Status: DC

## 2017-08-03 MED ORDER — OMEPRAZOLE 40 MG PO CPDR: 40.0000 mg | DELAYED_RELEASE_CAPSULE | Freq: Every day | ORAL | 0 refills | Status: DC

## 2017-08-03 MED ORDER — POTASSIUM CHLORIDE CRYS ER 10 MEQ PO TBCR: 10.0000 meq | EXTENDED_RELEASE_TABLET | Freq: Every day | ORAL | 3 refills | Status: AC

## 2017-08-03 MED ORDER — CALCIUM CARBONATE-VITAMIN D 500-200 MG-UNIT PO TABS: 1.0000 | ORAL_TABLET | Freq: Every day | ORAL | 3 refills | Status: DC

## 2017-08-03 MED ORDER — ALENDRONATE SODIUM 70 MG PO TABS: 70.0000 mg | ORAL_TABLET | ORAL | 3 refills | Status: DC

## 2017-08-03 MED ORDER — ASPIRIN EC 81 MG PO TBEC: 81.0000 mg | DELAYED_RELEASE_TABLET | Freq: Every day | ORAL | 3 refills | Status: DC

## 2017-08-03 MED ORDER — HYDROCHLOROTHIAZIDE 25 MG PO TABS: 25.0000 mg | ORAL_TABLET | Freq: Every day | ORAL | 3 refills | Status: AC

## 2017-08-03 NOTE — Telephone Encounter (Signed)
These are my meds, Ideally ALL of her meds, mine and her percocet, and her gabapentin from pain should be in there because she has memory problems and cannot remember them well. Can they do this??

## 2017-08-04 ENCOUNTER — Other Ambulatory Visit: Payer: Self-pay

## 2017-08-04 ENCOUNTER — Telehealth: Payer: Self-pay | Admitting: Family Medicine

## 2017-08-04 NOTE — Telephone Encounter (Signed)
Patient left message on nurse line stating that when she saw Dr. Delton See last ,she was taken off tramadol and given gabapentin, and the gabapentin is not working. Asks to speak to Dr. Delton See. Callback# [phone]

## 2017-08-04 NOTE — Telephone Encounter (Signed)
I took her off the tramadol and asked her to increase the gabapentin she was given by her pain doctor.  She needs to discuss pain management with them

## 2017-08-04 NOTE — Telephone Encounter (Signed)
Pt aware.

## 2017-08-08 ENCOUNTER — Other Ambulatory Visit: Payer: Self-pay

## 2017-08-08 ENCOUNTER — Encounter (HOSPITAL_COMMUNITY): Payer: Self-pay | Admitting: Psychiatry

## 2017-08-08 ENCOUNTER — Ambulatory Visit (INDEPENDENT_AMBULATORY_CARE_PROVIDER_SITE_OTHER): Payer: Medicare Other | Admitting: Psychiatry

## 2017-08-08 VITALS — BP 133/77 | HR 85 | Ht 64.5 in | Wt 138.4 lb

## 2017-08-08 DIAGNOSIS — G4700 Insomnia, unspecified: Secondary | ICD-10-CM

## 2017-08-08 DIAGNOSIS — R413 Other amnesia: Secondary | ICD-10-CM

## 2017-08-08 DIAGNOSIS — Z8673 Personal history of transient ischemic attack (TIA), and cerebral infarction without residual deficits: Secondary | ICD-10-CM | POA: Diagnosis not present

## 2017-08-08 DIAGNOSIS — Z9884 Bariatric surgery status: Secondary | ICD-10-CM | POA: Diagnosis not present

## 2017-08-08 DIAGNOSIS — F331 Major depressive disorder, recurrent, moderate: Secondary | ICD-10-CM

## 2017-08-08 DIAGNOSIS — F419 Anxiety disorder, unspecified: Secondary | ICD-10-CM

## 2017-08-08 DIAGNOSIS — M25519 Pain in unspecified shoulder: Secondary | ICD-10-CM | POA: Diagnosis not present

## 2017-08-08 DIAGNOSIS — Z79891 Long term (current) use of opiate analgesic: Secondary | ICD-10-CM | POA: Diagnosis not present

## 2017-08-08 DIAGNOSIS — M545 Low back pain: Secondary | ICD-10-CM | POA: Diagnosis not present

## 2017-08-08 DIAGNOSIS — M797 Fibromyalgia: Secondary | ICD-10-CM | POA: Diagnosis not present

## 2017-08-08 DIAGNOSIS — G3184 Mild cognitive impairment, so stated: Secondary | ICD-10-CM

## 2017-08-08 DIAGNOSIS — M25569 Pain in unspecified knee: Secondary | ICD-10-CM | POA: Diagnosis not present

## 2017-08-08 MED ORDER — DULOXETINE HCL 20 MG PO CPEP: 40.0000 mg | ORAL_CAPSULE | Freq: Every day | ORAL | 1 refills | Status: DC

## 2017-08-08 NOTE — Patient Instructions (Signed)
1. Continue duloxetine 40 mg daily 2. Return to clinic in one month

## 2017-08-09 ENCOUNTER — Other Ambulatory Visit (HOSPITAL_COMMUNITY): Payer: Self-pay | Admitting: Neurology

## 2017-08-09 ENCOUNTER — Ambulatory Visit (HOSPITAL_COMMUNITY)
Admission: RE | Admit: 2017-08-09 | Discharge: 2017-08-09 | Disposition: A | Discharge status: Home / Self Care | Payer: Medicare Other | Source: Ambulatory Visit | Attending: Neurology | Admitting: Neurology

## 2017-08-09 DIAGNOSIS — M545 Low back pain: Secondary | ICD-10-CM

## 2017-08-09 DIAGNOSIS — M4327 Fusion of spine, lumbosacral region: Secondary | ICD-10-CM | POA: Diagnosis not present

## 2017-08-09 LAB — RPR

## 2017-08-09 LAB — VITAMIN B12: VITAMIN B 12: 1319 pg/mL — AB (ref 200–1100)

## 2017-08-09 LAB — FOLATE

## 2017-08-15 NOTE — Telephone Encounter (Signed)
Please see if they can blister pak for more than one provider

## 2017-08-17 ENCOUNTER — Other Ambulatory Visit: Payer: Self-pay | Admitting: Family Medicine

## 2017-08-18 ENCOUNTER — Telehealth: Payer: Self-pay | Admitting: *Deleted

## 2017-08-18 DIAGNOSIS — Z79891 Long term (current) use of opiate analgesic: Secondary | ICD-10-CM | POA: Diagnosis not present

## 2017-08-18 DIAGNOSIS — M5416 Radiculopathy, lumbar region: Secondary | ICD-10-CM | POA: Diagnosis not present

## 2017-08-18 DIAGNOSIS — M545 Low back pain: Secondary | ICD-10-CM | POA: Diagnosis not present

## 2017-08-18 NOTE — Telephone Encounter (Signed)
Left message to call office

## 2017-08-18 NOTE — Telephone Encounter (Signed)
Called and spoke to pt, states " I went and got that needle in my back today, and I feel better" .

## 2017-08-18 NOTE — Telephone Encounter (Signed)
Call pain doctor

## 2017-08-18 NOTE — Telephone Encounter (Signed)
Patient left message returning call.

## 2017-08-18 NOTE — Telephone Encounter (Signed)
Patient called and states she was prescribed tramadol and she states she can't take that any more. It is making it hard for her to wake up in the morning.Patient states that she would like a injection for her nerve pain in her back. She does not want to take the Tramadol or the Gabapentin. Patient states you can call her if you have any questions. Her contact number is [phone].

## 2017-08-25 ENCOUNTER — Ambulatory Visit (INDEPENDENT_AMBULATORY_CARE_PROVIDER_SITE_OTHER): Payer: Medicare Other | Admitting: Licensed Clinical Social Worker

## 2017-08-25 ENCOUNTER — Ambulatory Visit (INDEPENDENT_AMBULATORY_CARE_PROVIDER_SITE_OTHER): Payer: Medicare Other

## 2017-08-25 ENCOUNTER — Ambulatory Visit: Payer: Self-pay

## 2017-08-25 DIAGNOSIS — F330 Major depressive disorder, recurrent, mild: Secondary | ICD-10-CM

## 2017-08-25 DIAGNOSIS — R350 Frequency of micturition: Secondary | ICD-10-CM

## 2017-08-25 LAB — POCT URINALYSIS DIPSTICK
BILIRUBIN UA: NEGATIVE
Glucose, UA: NEGATIVE
KETONES UA: NEGATIVE
Leukocytes, UA: NEGATIVE
Nitrite, UA: NEGATIVE
PH UA: 6 (ref 5.0–8.0)
Protein, UA: NEGATIVE
RBC UA: NEGATIVE
Spec Grav, UA: 1.015 (ref 1.010–1.025)
Urobilinogen, UA: 0.2 E.U./dL

## 2017-08-25 NOTE — Progress Notes (Signed)
   THERAPIST PROGRESS NOTE  Session Time: 8:00 am-8:45 am  Participation Level: Active  Behavioral Response: CasualAlertEuthymic  Type of Therapy: Individual Therapy  Treatment Goals addressed: Coping  Interventions: CBT and Solution Focused  Summary: [name] Rojas is a 76 y.o. female who presents  oriented x5 (person, place, situation, time and object), alert with periods of tearfulness, well groomed, euthymic, and cooperative to address mood and anxiety. Patient has a history of medical treatment including fibromyalgia, back surgeries, osteoporosis and back surgery. Patient has limited mental health treatment including medication management. Patient denies symptoms of mania. Patient denies suicidal and homicidal ideations. She denies symptoms of psychosis including auditory and visual hallucinations. Patient denies current or history of substance abuse. Patient is at no risk for lethality. Patient has a history of unhealthy relationships that include her family of origin, her former marriage and her relationship with two of her children. Patient has a history of feeling used and rejected in those relationships.  Patient had an average score of 7.50 out of 10 on the Session Rating Scale which is a increase of.25 since the last session. Patient explained that she has been fellowshipping with women from church. Patient shared memories that have troubled her including feeling like her mother loved her brother more than her when she was a child, her husband leaving her and two of her adult children not wanting to talk to her. Patient understood that each of the situations she shared she experienced rejection from another person. She also understood that she did not do anything to cause the other person to react/reject her. After discussion, patient understood to look for a lesson, and the message from her memories. Patient also understood that she needs to avoid getting "bored" due to feelings of  depression developing and bad memories arising during these times. Patient committed to focus on the present and stay active (engaged in the community, church, etc). Patient rated the session 10 out of 10 on the Session Rating Scale.  Patient engaged in session. She responded well to interventions. Patient continues to meet criteria for Major depressive disorder, recurrent episode, mild with anxious distress. Patient will continue in individual outpatient therapy due to being the least restrictive service to meet her needs. Patient made moderate progress on her goals.   Suicidal/Homicidal: Negativewithout intent/plan  Therapist Response: Therapist reviewed patient's recent thoughts and behaviors. Therapist utilized CBT to address mood. Therapist had patient identify what has gone well for her. Therapist processed patient's memories to identify triggers for mood. Therapist discussed when memories/thoughts occur and the importance of staying active. Therapist committed patient to focus on the present and stay active (engaged in the community, church, etc). Therapist administered the Outcome Rating Scale and the Session Rating Scale.  Plan: Return again in  4 weeks.           Therapist will review patient's goals on or before 10.02.2018.  Diagnosis: Axis I: ADHD, inattentive type and Major depressive disorder, recurrent episode, mild with anxious distress    Axis II: No diagnosis    Bynum BellowsJoshua Ahmari Garton, LCSW 08/25/2017

## 2017-09-04 NOTE — Progress Notes (Deleted)
BH MD/PA/NP OP Progress Note  09/04/2017 3:57 PM [name] Rojas  MRN:  [id number]  Chief Complaint:  HPI: *** Visit Diagnosis: No diagnosis found.  Past Psychiatric History:  I have reviewed the patient's psychiatry history in detail and updated the patient record. Outpatient: in 2013 Psychiatry admission: denies Previous suicide attempt: denies Past trials of medication: sertraline, Effexor, Wellbutrin, Buspar,  History of violence: denies  Past Medical History:  Past Medical History:  Diagnosis Date  . Anemia   . Anxiety   . Arthritis   . Chronic back pain   . Depression   . GERD (gastroesophageal reflux disease)   . Hypertension   . Osteoporosis   . Stroke [facility])     Past Surgical History:  Procedure Laterality Date  . ABDOMINAL HYSTERECTOMY     bleeding  . BACK SURGERY    . GASTRIC BYPASS    . KNEE SURGERY    . TONSILLECTOMY      Family Psychiatric History:  I have reviewed the patient's family history in detail and updated the patient record.  Family History:  Family History  Problem Relation Age of Onset  . Arthritis Mother   . Asthma Mother   . Heart disease Mother   . Early death Father        trauma    Social History:  Social History   Social History  . Marital status: Widowed    Spouse name: N/A  . Number of children: N/A  . Years of education: N/A   Social History Main Topics  . Smoking status: Never Smoker  . Smokeless tobacco: Never Used  . Alcohol use No  . Drug use: No  . Sexual activity: Not Currently   Other Topics Concern  . Not on file   Social History Narrative  . No narrative on file    Allergies:  Allergies  Allergen Reactions  . Codeine Nausea And Vomiting    Metabolic Disorder Labs: No results found for: HGBA1C, MPG No results found for: PROLACTIN Lab Results  Component Value Date   CHOL 150 04/24/2017   TRIG 55 04/24/2017   HDL 69 04/24/2017   CHOLHDL 2.2 04/24/2017   VLDL 11 04/24/2017   LDLCALC 70  04/24/2017   Lab Results  Component Value Date   TSH 0.67 04/25/2017    Therapeutic Level Labs: No results found for: LITHIUM No results found for: VALPROATE No components found for:  CBMZ  Current Medications: Current Outpatient Prescriptions  Medication Sig Dispense Refill  . alendronate (FOSAMAX) 70 MG tablet Take 1 tablet (70 mg total) by mouth every 7 (seven) days. Take with a full glass of water on an empty stomach. 12 tablet 3  . aspirin EC 81 MG tablet Take 1 tablet (81 mg total) by mouth daily. 90 tablet 3  . CALCIUM CITRATE-VITAMIN D PO Take by mouth.    . calcium-vitamin D (OSCAL WITH D) 500-200 MG-UNIT tablet Take 1 tablet by mouth daily with breakfast. 90 tablet 3  . DULoxetine (CYMBALTA) 20 MG capsule Take 2 capsules (40 mg total) by mouth daily. 60 capsule 1  . gabapentin (NEURONTIN) 100 MG capsule Take 300 mg by mouth 3 (three) times daily.     . hydrochlorothiazide (HYDRODIURIL) 25 MG tablet Take 1 tablet (25 mg total) by mouth daily. 90 tablet 3  . hydrOXYzine (ATARAX/VISTARIL) 25 MG tablet     . IRON PO Take by mouth.    . loratadine (CLARITIN) 10 MG tablet Take 10 mg  by mouth daily.    Marland Kitchen omeprazole (PRILOSEC) 40 MG capsule Take 1 capsule (40 mg total) by mouth daily. 90 capsule 0  . oxyCODONE-acetaminophen (PERCOCET) 7.5-325 MG tablet Take 1 tablet by mouth every 6 (six) hours as needed.     . potassium chloride (KLOR-CON M10) 10 MEQ tablet Take 1 tablet (10 mEq total) by mouth daily. 90 tablet 3  . tiZANidine (ZANAFLEX) 4 MG tablet Take 1 tablet (4 mg total) by mouth at bedtime. 30 tablet 11   No current facility-administered medications for this visit.      Musculoskeletal: Strength & Muscle Tone: within normal limits Gait & Station: normal Patient leans: N/A  Psychiatric Specialty Exam: ROS  There were no vitals taken for this visit.There is no height or weight on file to calculate BMI.  General Appearance: Fairly Groomed  Eye Contact:  Good  Speech:   Clear and Coherent  Volume:  Normal  Mood:  {BHH MOOD:22306}  Affect:  {Affect (PAA):22687}  Thought Process:  Coherent and Goal Directed  Orientation:  Full (Time, Place, and Person)  Thought Content: Logical   Suicidal Thoughts:  {ST/HT (PAA):22692}  Homicidal Thoughts:  {ST/HT (PAA):22692}  Memory:  Immediate;   Good Recent;   Good Remote;   Good  Judgement:  {Judgement (PAA):22694}  Insight:  {Insight (PAA):22695}  Psychomotor Activity:  Normal  Concentration:  Concentration: Good and Attention Span: Good  Recall:  Good  Fund of Knowledge: Good  Language: Good  Akathisia:  No  Handed:  Ambidextrous  AIMS (if indicated): not done  Assets:  Communication Skills Desire for Improvement  ADL's:  Intact  Cognition: WNL  Sleep:  {BHH GOOD/FAIR/POOR:22877}   MOCA 20/30 on 07/12/2017 (-4 for visuospatial/executive, -1 for naming, -5 for delayed recall)  Screenings: PHQ2-9     Office Visit from 06/27/2017 in [facility] Office Visit from 03/30/2017 in [facility]  PHQ-2 Total Score  6  0  PHQ-9 Total Score  14  -     Assessment and Plan:  [name] Rojas is a 76 y.o. year old female with a history of depression, fibromyalgia, hypertension, pulmonary hypertension, h/o stroke, GERD, s/p bariatric surgery , who presents for follow up appointment for No diagnosis found.  # MDD, moderate, recurrent without psychotic features # Unspecified anxiety disorder  Exam is notable for her improved affect, and patient reports improvement in neurovegetative symptoms and anxiety since uptitration of duloxetine. Will continue current dose to target mood and pain. Discussed behavioral activation.   # Mild neurocognitive disorder  She does mild neurocognitive deficits as evidenced in MOCA. ADL/IADL independent per report. Given she appears to cope well with her memory loss, will not start aricept at this time, although this option has been discussed. Will obtain labs to rule  out treatable cause of dementia (TSH wnl). Will continue to monitor with MOCA as needed.   Plan 1. Continue duloxetine 40 mg daily 2. Return to clinic in one month  3. Check RPR, vitamin B12, folate - She will continue to see Mr. Sheets for therapy  Neysa Hotter, MD 09/04/2017, 3:57 PM

## 2017-09-06 DIAGNOSIS — M545 Low back pain: Secondary | ICD-10-CM | POA: Diagnosis not present

## 2017-09-06 DIAGNOSIS — M797 Fibromyalgia: Secondary | ICD-10-CM | POA: Diagnosis not present

## 2017-09-06 DIAGNOSIS — M5416 Radiculopathy, lumbar region: Secondary | ICD-10-CM | POA: Diagnosis not present

## 2017-09-06 DIAGNOSIS — Z79891 Long term (current) use of opiate analgesic: Secondary | ICD-10-CM | POA: Diagnosis not present

## 2017-09-06 DIAGNOSIS — M25519 Pain in unspecified shoulder: Secondary | ICD-10-CM | POA: Diagnosis not present

## 2017-09-08 ENCOUNTER — Ambulatory Visit (HOSPITAL_COMMUNITY): Payer: Medicare Other | Admitting: Psychiatry
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
## 2017-09-21 ENCOUNTER — Telehealth (HOSPITAL_COMMUNITY): Payer: Self-pay | Admitting: Psychiatry

## 2017-09-21 NOTE — Telephone Encounter (Signed)
There is a notification that she receives duloxetine 30 mg from Dr. Delton See for the last two months (last on 9/12). Will follow this up.

## 2017-09-22 ENCOUNTER — Ambulatory Visit (INDEPENDENT_AMBULATORY_CARE_PROVIDER_SITE_OTHER): Payer: Medicare Other | Admitting: Licensed Clinical Social Worker

## 2017-09-22 DIAGNOSIS — F330 Major depressive disorder, recurrent, mild: Secondary | ICD-10-CM | POA: Diagnosis not present
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
## 2017-09-25 ENCOUNTER — Ambulatory Visit (INDEPENDENT_AMBULATORY_CARE_PROVIDER_SITE_OTHER): Payer: Medicare Other | Admitting: Psychiatry

## 2017-09-25 ENCOUNTER — Encounter (HOSPITAL_COMMUNITY): Payer: Self-pay | Admitting: Psychiatry

## 2017-09-25 VITALS — BP 142/82 | HR 70 | Ht 64.5 in | Wt 148.2 lb

## 2017-09-25 DIAGNOSIS — I272 Pulmonary hypertension, unspecified: Secondary | ICD-10-CM | POA: Diagnosis not present

## 2017-09-25 DIAGNOSIS — G3184 Mild cognitive impairment, so stated: Secondary | ICD-10-CM

## 2017-09-25 DIAGNOSIS — K219 Gastro-esophageal reflux disease without esophagitis: Secondary | ICD-10-CM

## 2017-09-25 DIAGNOSIS — F330 Major depressive disorder, recurrent, mild: Secondary | ICD-10-CM | POA: Insufficient documentation

## 2017-09-25 DIAGNOSIS — Z9884 Bariatric surgery status: Secondary | ICD-10-CM | POA: Diagnosis not present

## 2017-09-25 DIAGNOSIS — Z62811 Personal history of psychological abuse in childhood: Secondary | ICD-10-CM

## 2017-09-25 DIAGNOSIS — M797 Fibromyalgia: Secondary | ICD-10-CM | POA: Diagnosis not present

## 2017-09-25 MED ORDER — ESCITALOPRAM OXALATE 20 MG PO TABS: 20.0000 mg | ORAL_TABLET | Freq: Every day | ORAL | 2 refills | Status: DC

## 2017-09-25 NOTE — Patient Instructions (Signed)
1. Start lexapro 20 mg daily  2. Discontinue dulxoetine 3. Reviewed RPR, vitamin B12, folate - wnl 4. Return to clinic in three months

## 2017-09-28 ENCOUNTER — Other Ambulatory Visit: Payer: Self-pay | Admitting: Family Medicine

## 2017-09-28 NOTE — Telephone Encounter (Signed)
Seen [phone] 

## 2017-10-04 DIAGNOSIS — M25519 Pain in unspecified shoulder: Secondary | ICD-10-CM | POA: Diagnosis not present

## 2017-10-04 DIAGNOSIS — M797 Fibromyalgia: Secondary | ICD-10-CM | POA: Diagnosis not present

## 2017-10-04 DIAGNOSIS — M545 Low back pain: Secondary | ICD-10-CM | POA: Diagnosis not present

## 2017-10-04 DIAGNOSIS — Z79891 Long term (current) use of opiate analgesic: Secondary | ICD-10-CM | POA: Diagnosis not present

## 2017-10-30 ENCOUNTER — Ambulatory Visit (INDEPENDENT_AMBULATORY_CARE_PROVIDER_SITE_OTHER): Payer: Self-pay | Admitting: Otolaryngology

## 2017-10-30 ENCOUNTER — Other Ambulatory Visit: Payer: Self-pay | Admitting: Family Medicine

## 2017-10-31 NOTE — Telephone Encounter (Signed)
Seen [phone] 

## 2017-11-03 ENCOUNTER — Encounter (HOSPITAL_COMMUNITY): Payer: Self-pay | Admitting: Licensed Clinical Social Worker

## 2017-11-03 ENCOUNTER — Ambulatory Visit (INDEPENDENT_AMBULATORY_CARE_PROVIDER_SITE_OTHER): Payer: Medicare Other | Admitting: Licensed Clinical Social Worker

## 2017-11-03 DIAGNOSIS — F330 Major depressive disorder, recurrent, mild: Secondary | ICD-10-CM | POA: Diagnosis not present
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
## 2017-11-09 DIAGNOSIS — M545 Low back pain: Secondary | ICD-10-CM | POA: Diagnosis not present

## 2017-11-13 ENCOUNTER — Other Ambulatory Visit (HOSPITAL_COMMUNITY)
Admission: RE | Admit: 2017-11-13 | Discharge: 2017-11-13 | Disposition: A | Discharge status: Home / Self Care | Payer: Medicare Other | Source: Ambulatory Visit | Attending: Family Medicine | Admitting: Family Medicine

## 2017-11-13 ENCOUNTER — Ambulatory Visit (INDEPENDENT_AMBULATORY_CARE_PROVIDER_SITE_OTHER): Payer: Medicare Other | Admitting: Family Medicine

## 2017-11-13 ENCOUNTER — Encounter: Payer: Self-pay | Admitting: Family Medicine

## 2017-11-13 ENCOUNTER — Other Ambulatory Visit: Payer: Self-pay

## 2017-11-13 VITALS — BP 162/84 | HR 80 | Temp 97.5°F | Resp 16 | Ht 65.0 in | Wt 153.1 lb

## 2017-11-13 DIAGNOSIS — M545 Low back pain: Secondary | ICD-10-CM | POA: Diagnosis not present

## 2017-11-13 DIAGNOSIS — N952 Postmenopausal atrophic vaginitis: Secondary | ICD-10-CM | POA: Diagnosis not present

## 2017-11-13 DIAGNOSIS — M797 Fibromyalgia: Secondary | ICD-10-CM | POA: Diagnosis not present

## 2017-11-13 DIAGNOSIS — M130 Polyarthritis, unspecified: Secondary | ICD-10-CM | POA: Diagnosis not present

## 2017-11-13 DIAGNOSIS — R300 Dysuria: Secondary | ICD-10-CM | POA: Insufficient documentation

## 2017-11-13 DIAGNOSIS — N3281 Overactive bladder: Secondary | ICD-10-CM

## 2017-11-13 DIAGNOSIS — Z79891 Long term (current) use of opiate analgesic: Secondary | ICD-10-CM | POA: Diagnosis not present

## 2017-11-13 LAB — POCT URINALYSIS DIPSTICK
Bilirubin, UA: NEGATIVE
Blood, UA: NEGATIVE
Glucose, UA: NEGATIVE
Ketones, UA: NEGATIVE
LEUKOCYTES UA: NEGATIVE
Nitrite, UA: NEGATIVE
PROTEIN UA: NEGATIVE
SPEC GRAV UA: 1.01 (ref 1.010–1.025)
Urobilinogen, UA: 0.2 E.U./dL
pH, UA: 5.5 (ref 5.0–8.0)

## 2017-11-13 MED ORDER — MIRABEGRON ER 25 MG PO TB24: 25.0000 mg | ORAL_TABLET | Freq: Every day | ORAL | 11 refills | Status: DC

## 2017-11-13 MED ORDER — CLOBETASOL PROPIONATE 0.05 % EX OINT: TOPICAL_OINTMENT | CUTANEOUS | 6 refills | Status: DC

## 2017-11-13 NOTE — Progress Notes (Signed)
Chief Complaint  Patient presents with  . Dysuria    x 3 months   Patient is here for work in appointment.  She complained on the following of dysuria.  When she got here, it does not sound like an acute urinary tract infection is much as it sounds like she has had frequency, urgency, little bit of incontinence, and some burning worsening over the last 3 months.  We did a urinalysis in the office it was negative.  I am going to send it for culture as well as urine cytology to make sure she does not have BV or a vaginal infection.  She is not sexually active.  She had a hysterectomy around the age of 76, 35 years ago.  She does not have any flank pain or fever or suprapubic tenderness.  She does not have any vaginal bleeding or discharge.  We discussed that she likely has some atrophic vaginitis causing discomfort.  In addition she has an overactive bladder, not unusual for her age.  I am going to institute some treatment, and think about referring her if she fails to improve.  Patient Active Problem List   Diagnosis Date Noted  . Major depressive disorder, recurrent episode, mild with anxious distress (HCC) 09/25/2017  . Major depressive disorder, recurrent episode, moderate (HCC) 07/12/2017  . Mild neurocognitive disorder 07/12/2017  . Vitamin D deficiency 06/27/2017  . Osteoporosis 06/27/2017  . Fibromyalgia 03/30/2017  . History of medication noncompliance 03/30/2017  . Osteoporosis of lower leg associated with endocrine disorder 03/30/2017  . Primary osteoarthritis of left shoulder 04/11/2016  . Hearing difficulty 09/17/2015  . Neuropathic pain 04/10/2013  . Anxiety disorder 04/10/2013  . Failed back syndrome 08/24/2012  . Pulmonary hypertension (HCC) 04/25/2012  . Congenital heart disease with intracardiac shunting 04/25/2012  . History of stroke 05/23/2011  . Status post total knee replacement 02/03/2010  . GERD (gastroesophageal reflux disease) 02/03/2010  . Chronic pain  02/02/2010  . Essential hypertension 01/15/2010  . Status post bariatric surgery 04/30/2009    Outpatient Encounter Medications as of 11/13/2017  Medication Sig  . alendronate (FOSAMAX) 70 MG tablet Take 1 tablet (70 mg total) by mouth every 7 (seven) days. Take with a full glass of water on an empty stomach.  Marland Kitchen. aspirin EC 81 MG tablet Take 1 tablet (81 mg total) by mouth daily.  Marland Kitchen. CALCIUM CITRATE-VITAMIN D PO Take by mouth.  . calcium-vitamin D (OSCAL WITH D) 500-200 MG-UNIT tablet Take 1 tablet by mouth daily with breakfast.  . escitalopram (LEXAPRO) 20 MG tablet Take 1 tablet (20 mg total) by mouth daily.  Marland Kitchen. gabapentin (NEURONTIN) 100 MG capsule Take 300 mg by mouth 3 (three) times daily.   . hydrochlorothiazide (HYDRODIURIL) 25 MG tablet Take 1 tablet (25 mg total) by mouth daily.  . hydrOXYzine (ATARAX/VISTARIL) 25 MG tablet   . IRON PO Take by mouth.  . loratadine (CLARITIN) 10 MG tablet Take 10 mg by mouth daily.  Marland Kitchen. omeprazole (PRILOSEC) 40 MG capsule Take 1 capsule (40 mg total) by mouth daily.  Marland Kitchen. omeprazole (PRILOSEC) 40 MG capsule TAKE 1 CAPSULE BY MOUTH EVERY DAY  . omeprazole (PRILOSEC) 40 MG capsule TAKE 1 CAPSULE BY MOUTH EVERY DAY.  Marland Kitchen. oxyCODONE-acetaminophen (PERCOCET) 7.5-325 MG tablet Take 1 tablet by mouth every 6 (six) hours as needed.   . potassium chloride (KLOR-CON M10) 10 MEQ tablet Take 1 tablet (10 mEq total) by mouth daily.  Marland Kitchen. tiZANidine (ZANAFLEX) 4 MG tablet Take  1 tablet (4 mg total) by mouth at bedtime.  . clobetasol ointment (TEMOVATE) 0.05 % Apply to vaginal area twice a day for 2 weeks, then as needed for discomfort  . mirabegron ER (MYRBETRIQ) 25 MG TB24 tablet Take 1 tablet (25 mg total) by mouth daily.   No facility-administered encounter medications on file as of 11/13/2017.     Allergies  Allergen Reactions  . Codeine Nausea And Vomiting    Review of Systems  Constitutional: Negative for activity change, appetite change and unexpected weight  change.  HENT: Negative for congestion, dental problem, postnasal drip and rhinorrhea.   Eyes: Negative for redness and visual disturbance.  Respiratory: Negative for cough and shortness of breath.   Cardiovascular: Negative for chest pain, palpitations and leg swelling.  Gastrointestinal: Negative for abdominal pain, constipation and diarrhea.  Genitourinary: Positive for dysuria, frequency and urgency. Negative for difficulty urinating, enuresis and flank pain.  Musculoskeletal: Positive for back pain. Negative for arthralgias.       Muscle cramps, occasional  Neurological: Negative for dizziness and headaches.  Psychiatric/Behavioral: Negative for dysphoric mood and sleep disturbance. The patient is nervous/anxious.        Again, questions why she cannot have Valium.  Again, reminded that she needs to take this up with her psychiatrist.    BP (!) 162/84 (BP Location: Left Arm, Patient Position: Sitting, Cuff Size: Normal)   Pulse 80   Temp (!) 97.5 F (36.4 C) (Temporal)   Resp 16   Ht 5\' 5"  (1.651 m)   Wt 153 lb 1.9 oz (69.5 kg)   SpO2 98%   BMI 25.48 kg/m   Physical Exam  Constitutional: She is oriented to person, place, and time. She appears well-developed and well-nourished. No distress.  HENT:  Head: Normocephalic and atraumatic.  Mouth/Throat: Oropharynx is clear and moist.  Cardiovascular: Normal rate, regular rhythm and normal heart sounds.  Pulmonary/Chest: Effort normal and breath sounds normal.  Abdominal: Soft. Bowel sounds are normal. There is no tenderness.  Neurological: She is alert and oriented to person, place, and time.  Skin: Skin is warm and dry.  Psychiatric: Her behavior is normal. Thought content normal.  Mildly anxious   Results for orders placed or performed in visit on 11/13/17  POCT urinalysis dipstick  Result Value Ref Range   Color, UA yellow    Clarity, UA clear    Glucose, UA neg    Bilirubin, UA neg    Ketones, UA neg    Spec Grav, UA  1.010 1.010 - 1.025   Blood, UA neg    pH, UA 5.5 5.0 - 8.0   Protein, UA neg    Urobilinogen, UA 0.2 0.2 or 1.0 E.U./dL   Nitrite, UA neg    Leukocytes, UA Negative Negative    ASSESSMENT/PLAN:  1. Dysuria  - POCT urinalysis dipstick - Urine cytology ancillary only - Urine Culture  2. OAB (overactive bladder)   3. Atrophic vaginitis   See discussion in HPI   Patient Instructions  We will check urine for infection Take the bladder control pill once a day Use the ointment aw directed Stop fluids 2 hours before bed to improve sleep  See me in Jan as scheduled   Eustace MooreYvonne Sue Shenique Childers, MD

## 2017-11-13 NOTE — Patient Instructions (Signed)
We will check urine for infection Take the bladder control pill once a day Use the ointment aw directed Stop fluids 2 hours before bed to improve sleep  See me in Jan as scheduled

## 2017-11-14 ENCOUNTER — Telehealth: Payer: Self-pay | Admitting: *Deleted

## 2017-11-14 LAB — URINE CULTURE
MICRO NUMBER: 81324671
Result:: NO GROWTH
SPECIMEN QUALITY:: ADEQUATE

## 2017-11-14 NOTE — Telephone Encounter (Signed)
Patient called stating Dr Delton SeeNelson told her she would send prescription over next door for her nerves. Please advise

## 2017-11-15 NOTE — Telephone Encounter (Signed)
Absolutely not.  She requested Valium and I told her that she needed to discuss this with her psychiatrist

## 2017-11-15 NOTE — Telephone Encounter (Signed)
Patient informed of message below, verbalized understanding.  

## 2017-11-17 LAB — URINE CYTOLOGY ANCILLARY ONLY
Bacterial vaginitis: NEGATIVE
CANDIDA VAGINITIS: NEGATIVE

## 2017-11-22 ENCOUNTER — Encounter: Payer: Self-pay | Admitting: Family Medicine

## 2017-12-04 ENCOUNTER — Ambulatory Visit: Payer: Medicare Other

## 2017-12-04 VITALS — BP 128/86 | HR 72 | Temp 96.3°F | Resp 16 | Ht 65.0 in | Wt 156.0 lb

## 2017-12-04 DIAGNOSIS — Z0000 Encounter for general adult medical examination without abnormal findings: Secondary | ICD-10-CM

## 2017-12-04 NOTE — Progress Notes (Signed)
Subjective:   [name] Rojas is a 76 y.o. female who presents for an Initial Medicare Annual Wellness Visit.  Review of Systems            Objective:    Today's Vitals   12/04/17 0811  BP: 128/86  Pulse: 72  Resp: 16  Temp: (!) 96.3 F (35.7 C)  TempSrc: Temporal  SpO2: 96%  Weight: 156 lb (70.8 kg)  Height: 5\' 5"  (1.651 m)  PainSc: 10-Worst pain ever  PainLoc: Back   Body mass index is 25.96 kg/m.  Advanced Directives 12/04/2017 07/05/2017 02/01/2017  Does Patient Have a Medical Advance Directive? No No No  Would patient like information on creating a medical advance directive? No - Patient declined - -    Current Medications (verified) Outpatient Encounter Medications as of 12/04/2017  Medication Sig  . alendronate (FOSAMAX) 70 MG tablet Take 1 tablet (70 mg total) by mouth every 7 (seven) days. Take with a full glass of water on an empty stomach.  Marland Kitchen aspirin EC 81 MG tablet Take 1 tablet (81 mg total) by mouth daily.  Marland Kitchen CALCIUM CITRATE-VITAMIN D PO Take by mouth.  . calcium-vitamin D (OSCAL WITH D) 500-200 MG-UNIT tablet Take 1 tablet by mouth daily with breakfast.  . clobetasol ointment (TEMOVATE) 0.05 % Apply to vaginal area twice a day for 2 weeks, then as needed for discomfort  . escitalopram (LEXAPRO) 20 MG tablet Take 1 tablet (20 mg total) by mouth daily.  Marland Kitchen gabapentin (NEURONTIN) 100 MG capsule Take 300 mg by mouth 3 (three) times daily.   . hydrochlorothiazide (HYDRODIURIL) 25 MG tablet Take 1 tablet (25 mg total) by mouth daily.  . hydrOXYzine (ATARAX/VISTARIL) 25 MG tablet   . IRON PO Take by mouth.  . loratadine (CLARITIN) 10 MG tablet Take 10 mg by mouth daily.  . mirabegron ER (MYRBETRIQ) 25 MG TB24 tablet Take 1 tablet (25 mg total) by mouth daily.  Marland Kitchen omeprazole (PRILOSEC) 40 MG capsule Take 1 capsule (40 mg total) by mouth daily.  Marland Kitchen omeprazole (PRILOSEC) 40 MG capsule TAKE 1 CAPSULE BY MOUTH EVERY DAY  . omeprazole (PRILOSEC) 40 MG capsule TAKE 1  CAPSULE BY MOUTH EVERY DAY.  Marland Kitchen potassium chloride (KLOR-CON M10) 10 MEQ tablet Take 1 tablet (10 mEq total) by mouth daily.  Marland Kitchen tiZANidine (ZANAFLEX) 4 MG tablet Take 1 tablet (4 mg total) by mouth at bedtime.  Marland Kitchen HYDROcodone-acetaminophen (NORCO) 10-325 MG tablet   . [DISCONTINUED] oxyCODONE-acetaminophen (PERCOCET) 7.5-325 MG tablet Take 1 tablet by mouth every 6 (six) hours as needed.    No facility-administered encounter medications on file as of 12/04/2017.     Allergies (verified) Codeine   History: Past Medical History:  Diagnosis Date  . Anemia   . Anxiety   . Arthritis   . Chronic back pain   . Depression   . Fibromyalgia   . GERD (gastroesophageal reflux disease)   . Hypertension   . Osteoporosis   . Stroke [facility])    Past Surgical History:  Procedure Laterality Date  . ABDOMINAL HYSTERECTOMY     bleeding  . BACK SURGERY    . GASTRIC BYPASS    . KNEE SURGERY    . TONSILLECTOMY     Family History  Problem Relation Age of Onset  . Arthritis Mother   . Asthma Mother   . Heart disease Mother   . Early death Father        trauma  . Heart disease Sister   .  Stroke Sister    Social History   Socioeconomic History  . Marital status: Divorced    Spouse name: None  . Number of children: 7  . Years of education: None  . Highest education level: GED or equivalent  Social Needs  . Financial resource strain: Not very hard  . Food insecurity - worry: Never true  . Food insecurity - inability: Never true  . Transportation needs - medical: No  . Transportation needs - non-medical: No  Occupational History  . Occupation: retired  Tobacco Use  . Smoking status: Never Smoker  . Smokeless tobacco: Never Used  Substance and Sexual Activity  . Alcohol use: No  . Drug use: No  . Sexual activity: Not Currently  Other Topics Concern  . None  Social History Narrative  . None    Tobacco Counseling Counseling given: Not Answered   Clinical Intake:     Pain  Score: 10-Worst pain ever                  Activities of Daily Living In your present state of health, do you have any difficulty performing the following activities: 12/04/2017 11/13/2017  Hearing? N N  Vision? N N  Difficulty concentrating or making decisions? N N  Walking or climbing stairs? N N  Dressing or bathing? N N  Doing errands, shopping? N N  Preparing Food and eating ? N -  Using the Toilet? N -  In the past six months, have you accidently leaked urine? N -  Do you have problems with loss of bowel control? N -  Managing your Medications? N -  Managing your Finances? N -  Housekeeping or managing your Housekeeping? N -     Immunizations and Health Maintenance Immunization History  Administered Date(s) Administered  . Influenza, Seasonal, Injecte, Preservative Fre 10/01/2013, 11/18/2014, 01/05/2016  . Influenza-Unspecified 10/02/2017  . Pneumococcal Polysaccharide-23 10/11/2016  . Zoster 09/08/2016   Health Maintenance Due  Topic Date Due  . COLONOSCOPY  03/29/1992  . DEXA SCAN  03/30/2007  . PNA vac Low Risk Adult (2 of 2 - PCV13) 10/11/2017    Patient Care Team: Eustace MooreNelson, Yvonne Sue, MD as PCP - General (Family Medicine)  Indicate any recent Medical Services you may have received from other than [facility] providers in the past year (date may be approximate).     Assessment:   This is a routine wellness examination for [name] Rojas.  Hearing/Vision screen No exam data present  Dietary issues and exercise activities discussed: Current Exercise Habits: Home exercise routine, Type of exercise: walking, Time (Minutes): 30, Frequency (Times/Week): 1, Weekly Exercise (Minutes/Week): 30, Intensity: Mild, Exercise limited by: None identified  Goals    . DIET - INCREASE WATER INTAKE      Depression Screen PHQ 2/9 Scores 12/04/2017 11/13/2017 06/27/2017 03/30/2017  PHQ - 2 Score 0 0 6 0  PHQ- 9 Score - - 14 -    Fall Risk Fall Risk  12/04/2017 11/13/2017  06/27/2017 03/30/2017  Falls in the past year? No No No No    Is the patient's home free of loose throw rugs in walkways, pet beds, electrical cords, etc?   none     Grab bars in the bathroom? none      Handrails on the stairs?   n/a      Adequate lighting?   yes    Cognitive Function:     6CIT Screen 12/04/2017  What Year? 0 points  What month? 0  points  What time? 0 points  Count back from 20 0 points  Months in reverse 0 points  Repeat phrase 0 points  Total Score 0    Screening Tests Health Maintenance  Topic Date Due  . COLONOSCOPY  03/29/1992  . DEXA SCAN  03/30/2007  . PNA vac Low Risk Adult (2 of 2 - PCV13) 10/11/2017  . TETANUS/TDAP  12/19/2025  . INFLUENZA VACCINE  Completed    Qualifies for Shingles Vaccine? done  Cancer Screenings: Lung: Low Dose CT Chest recommended if Age 24-80 years, 30 pack-year currently smoking OR have quit w/in 15years. Patient does not qualify. Breast: Up to date on Mammogram? yes  Up to date of Bone Density/Dexa? Discuss with pcp Colorectal: discuss with pcp  Additional Screenings:  Hepatitis B/HIV/Syphillis: Hepatitis C Screening:      Plan:     I have personally reviewed and noted the following in the patient's chart:   . Medical and social history . Use of alcohol, tobacco or illicit drugs  . Current medications and supplements . Functional ability and status . Nutritional status . Physical activity . Advanced directives . List of other physicians . Hospitalizations, surgeries, and ER visits in previous 12 months . Vitals . Screenings to include cognitive, depression, and falls . Referrals and appointments  In addition, I have reviewed and discussed with patient certain preventive protocols, quality metrics, and [facility] practice recommendations. A written personalized care plan for preventive services as well as general preventive health recommendations were provided to patient.     Crawford GivensSelena M Putman,  LPN   16/10/[id number]/17/2018

## 2017-12-04 NOTE — Patient Instructions (Signed)
Ms. [name] Rojas , Thank you for taking time to come for your Medicare Wellness Visit. I appreciate your ongoing commitment to your health goals. Please review the following plan we discussed and let me know if I can assist you in the future.   Screening recommendations/referrals: Colonoscopy: discuss with pcp Mammogram: done Bone Density: discuss with pcp Recommended yearly ophthalmology/optometry visit for glaucoma screening and checkup Recommended yearly dental visit for hygiene and checkup  Vaccinations: Influenza vaccine: done Pneumococcal vaccine: discuss with pcp Tdap vaccine: done per Aida Shingles vaccine: discuss with pcp   Advanced directives: no  Conditions/risks identified: none  Next appointment: yearly   Preventive Care 65 Years and Older, Female Preventive care refers to lifestyle choices and visits with your health care provider that can promote health and wellness. What does preventive care include?  A yearly physical exam. This is also called an annual well check.  Dental exams once or twice a year.  Routine eye exams. Ask your health care provider how often you should have your eyes checked.  Personal lifestyle choices, including:  Daily care of your teeth and gums.  Regular physical activity.  Eating a healthy diet.  Avoiding tobacco and drug use.  Limiting alcohol use.  Practicing safe sex.  Taking low-dose aspirin every day.  Taking vitamin and mineral supplements as recommended by your health care provider. What happens during an annual well check? The services and screenings done by your health care provider during your annual well check will depend on your age, overall health, lifestyle risk factors, and family history of disease. Counseling  Your health care provider may ask you questions about your:  Alcohol use.  Tobacco use.  Drug use.  Emotional well-being.  Home and relationship well-being.  Sexual activity.  Eating  habits.  History of falls.  Memory and ability to understand (cognition).  Work and work Astronomer.  Reproductive health. Screening  You may have the following tests or measurements:  Height, weight, and BMI.  Blood pressure.  Lipid and cholesterol levels. These may be checked every 5 years, or more frequently if you are over 77 years old.  Skin check.  Lung cancer screening. You may have this screening every year starting at age 3 if you have a 30-pack-year history of smoking and currently smoke or have quit within the past 15 years.  Fecal occult blood test (FOBT) of the stool. You may have this test every year starting at age 63.  Flexible sigmoidoscopy or colonoscopy. You may have a sigmoidoscopy every 5 years or a colonoscopy every 10 years starting at age 69.  Hepatitis C blood test.  Hepatitis B blood test.  Sexually transmitted disease (STD) testing.  Diabetes screening. This is done by checking your blood sugar (glucose) after you have not eaten for a while (fasting). You may have this done every 1-3 years.  Bone density scan. This is done to screen for osteoporosis. You may have this done starting at age 44.  Mammogram. This may be done every 1-2 years. Talk to your health care provider about how often you should have regular mammograms. Talk with your health care provider about your test results, treatment options, and if necessary, the need for more tests. Vaccines  Your health care provider may recommend certain vaccines, such as:  Influenza vaccine. This is recommended every year.  Tetanus, diphtheria, and acellular pertussis (Tdap, Td) vaccine. You may need a Td booster every 10 years.  Zoster vaccine. You may need this after  age 44.  Pneumococcal 13-valent conjugate (PCV13) vaccine. One dose is recommended after age 55.  Pneumococcal polysaccharide (PPSV23) vaccine. One dose is recommended after age 82. Talk to your health care provider about which  screenings and vaccines you need and how often you need them. This information is not intended to replace advice given to you by your health care provider. Make sure you discuss any questions you have with your health care provider. Document Released: 01/01/2016 Document Revised: 08/24/2016 Document Reviewed: 10/06/2015 Elsevier Interactive Patient Education  2017 [place] Prevention in the Home Falls can cause injuries. They can happen to people of all ages. There are many things you can do to make your home safe and to help prevent falls. What can I do on the outside of my home?  Regularly fix the edges of walkways and driveways and fix any cracks.  Remove anything that might make you trip as you walk through a door, such as a raised step or threshold.  Trim any bushes or trees on the path to your home.  Use bright outdoor lighting.  Clear any walking paths of anything that might make someone trip, such as rocks or tools.  Regularly check to see if handrails are loose or broken. Make sure that both sides of any steps have handrails.  Any raised decks and porches should have guardrails on the edges.  Have any leaves, snow, or ice cleared regularly.  Use sand or salt on walking paths during winter.  Clean up any spills in your garage right away. This includes oil or grease spills. What can I do in the bathroom?  Use night lights.  Install grab bars by the toilet and in the tub and shower. Do not use towel bars as grab bars.  Use non-skid mats or decals in the tub or shower.  If you need to sit down in the shower, use a plastic, non-slip stool.  Keep the floor dry. Clean up any water that spills on the floor as soon as it happens.  Remove soap buildup in the tub or shower regularly.  Attach bath mats securely with double-sided non-slip rug tape.  Do not have throw rugs and other things on the floor that can make you trip. What can I do in the bedroom?  Use  night lights.  Make sure that you have a light by your bed that is easy to reach.  Do not use any sheets or blankets that are too big for your bed. They should not hang down onto the floor.  Have a firm chair that has side arms. You can use this for support while you get dressed.  Do not have throw rugs and other things on the floor that can make you trip. What can I do in the kitchen?  Clean up any spills right away.  Avoid walking on wet floors.  Keep items that you use a lot in easy-to-reach places.  If you need to reach something above you, use a strong step stool that has a grab bar.  Keep electrical cords out of the way.  Do not use floor polish or wax that makes floors slippery. If you must use wax, use non-skid floor wax.  Do not have throw rugs and other things on the floor that can make you trip. What can I do with my stairs?  Do not leave any items on the stairs.  Make sure that there are handrails on both sides of the stairs  and use them. Fix handrails that are broken or loose. Make sure that handrails are as long as the stairways.  Check any carpeting to make sure that it is firmly attached to the stairs. Fix any carpet that is loose or worn.  Avoid having throw rugs at the top or bottom of the stairs. If you do have throw rugs, attach them to the floor with carpet tape.  Make sure that you have a light switch at the top of the stairs and the bottom of the stairs. If you do not have them, ask someone to add them for you. What else can I do to help prevent falls?  Wear shoes that:  Do not have high heels.  Have rubber bottoms.  Are comfortable and fit you well.  Are closed at the toe. Do not wear sandals.  If you use a stepladder:  Make sure that it is fully opened. Do not climb a closed stepladder.  Make sure that both sides of the stepladder are locked into place.  Ask someone to hold it for you, if possible.  Clearly mark and make sure that you  can see:  Any grab bars or handrails.  First and last steps.  Where the edge of each step is.  Use tools that help you move around (mobility aids) if they are needed. These include:  Canes.  Walkers.  Scooters.  Crutches.  Turn on the lights when you go into a dark area. Replace any light bulbs as soon as they burn out.  Set up your furniture so you have a clear path. Avoid moving your furniture around.  If any of your floors are uneven, fix them.  If there are any pets around you, be aware of where they are.  Review your medicines with your doctor. Some medicines can make you feel dizzy. This can increase your chance of falling. Ask your doctor what other things that you can do to help prevent falls. This information is not intended to replace advice given to you by your health care provider. Make sure you discuss any questions you have with your health care provider. Document Released: 10/01/2009 Document Revised: 05/12/2016 Document Reviewed: 01/09/2015 Elsevier Interactive Patient Education  2017 [employer]  Preventive Care for Adults  A healthy lifestyle and preventive care can promote health and wellness. Preventive health guidelines for adults include the following key practices.  . A routine yearly physical is a good way to check with your health care provider about your health and preventive screening. It is a chance to share any concerns and updates on your health and to receive a thorough exam.  . Visit your dentist for a routine exam and preventive care every 6 months. Brush your teeth twice a day and floss once a day. Good oral hygiene prevents tooth decay and gum disease.  . The frequency of eye exams is based on your age, health, family medical history, use  of contact lenses, and other factors. Follow your health care provider's ecommendations for frequency of eye exams.  . Eat a healthy diet. Foods like vegetables, fruits, whole grains, low-fat dairy  products, and lean protein foods contain the nutrients you need without too many calories. Decrease your intake of foods high in solid fats, added sugars, and salt. Eat the right amount of calories for you. Get information about a proper diet from your health care provider, if necessary.  . Regular physical exercise is one of the most important things you can  do for your health. Most adults should get at least 150 minutes of moderate-intensity exercise (any activity that increases your heart rate and causes you to sweat) each week. In addition, most adults need muscle-strengthening exercises on 2 or more days a week.  Silver Sneakers may be a benefit available to you. To determine eligibility, you may visit the website: www.silversneakers.com or contact program at 25359375851-708-619-3369 Mon-Fri between 8AM-8PM.   . Maintain a healthy weight. The body mass index (BMI) is a screening tool to identify possible weight problems. It provides an estimate of body fat based on height and weight. Your health care provider can find your BMI and can help you achieve or maintain a healthy weight.   For adults 20 years and older: ? A BMI below 18.5 is considered underweight. ? A BMI of 18.5 to 24.9 is normal. ? A BMI of 25 to 29.9 is considered overweight. ? A BMI of 30 and above is considered obese.   . Maintain normal blood lipids and cholesterol levels by exercising and minimizing your intake of saturated fat. Eat a balanced diet with plenty of fruit and vegetables. Blood tests for lipids and cholesterol should begin at age 75 and be repeated every 5 years. If your lipid or cholesterol levels are high, you are over 50, or you are at high risk for heart disease, you may need your cholesterol levels checked more frequently. Ongoing high lipid and cholesterol levels should be treated with medicines if diet and exercise are not working.  . If you smoke, find out from your health care provider how to quit. If you do not  use tobacco, please do not start.  . If you choose to drink alcohol, please do not consume more than 2 drinks per day. One drink is considered to be 12 ounces (355 mL) of beer, 5 ounces (148 mL) of wine, or 1.5 ounces (44 mL) of liquor.  . If you are 7355-75 years old, ask your health care provider if you should take aspirin to prevent strokes.  . Use sunscreen. Apply sunscreen liberally and repeatedly throughout the day. You should seek shade when your shadow is shorter than you. Protect yourself by wearing long sleeves, pants, a wide-brimmed hat, and sunglasses year round, whenever you are outdoors.  . Once a month, do a whole body skin exam, using a mirror to look at the skin on your back. Tell your health care provider of new moles, moles that have irregular borders, moles that are larger than a pencil eraser, or moles that have changed in shape or color.

## 2017-12-06 ENCOUNTER — Encounter (HOSPITAL_COMMUNITY): Payer: Self-pay | Admitting: Psychiatry

## 2017-12-06 NOTE — Telephone Encounter (Signed)
This encounter was created in error - please disregard.
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
## 2017-12-25 ENCOUNTER — Encounter: Payer: Self-pay | Admitting: Family Medicine

## 2017-12-25 ENCOUNTER — Encounter (HOSPITAL_COMMUNITY): Payer: Self-pay | Admitting: Psychiatry

## 2017-12-25 ENCOUNTER — Ambulatory Visit (INDEPENDENT_AMBULATORY_CARE_PROVIDER_SITE_OTHER): Payer: Medicare Other | Admitting: Psychiatry

## 2017-12-25 ENCOUNTER — Telehealth: Payer: Self-pay | Admitting: Family Medicine

## 2017-12-25 ENCOUNTER — Ambulatory Visit (INDEPENDENT_AMBULATORY_CARE_PROVIDER_SITE_OTHER): Payer: Medicare Other | Admitting: Family Medicine

## 2017-12-25 ENCOUNTER — Other Ambulatory Visit: Payer: Self-pay

## 2017-12-25 VITALS — BP 144/80 | HR 63 | Temp 98.7°F | Resp 16 | Ht 65.0 in | Wt 156.5 lb

## 2017-12-25 VITALS — BP 128/84 | HR 90 | Ht 65.0 in | Wt 144.0 lb

## 2017-12-25 DIAGNOSIS — F419 Anxiety disorder, unspecified: Secondary | ICD-10-CM

## 2017-12-25 DIAGNOSIS — I10 Essential (primary) hypertension: Secondary | ICD-10-CM | POA: Diagnosis not present

## 2017-12-25 DIAGNOSIS — E559 Vitamin D deficiency, unspecified: Secondary | ICD-10-CM | POA: Diagnosis not present

## 2017-12-25 DIAGNOSIS — F330 Major depressive disorder, recurrent, mild: Secondary | ICD-10-CM

## 2017-12-25 DIAGNOSIS — G4700 Insomnia, unspecified: Secondary | ICD-10-CM | POA: Diagnosis not present

## 2017-12-25 DIAGNOSIS — G8929 Other chronic pain: Secondary | ICD-10-CM

## 2017-12-25 DIAGNOSIS — R450 Nervousness: Secondary | ICD-10-CM

## 2017-12-25 DIAGNOSIS — M549 Dorsalgia, unspecified: Secondary | ICD-10-CM | POA: Diagnosis not present

## 2017-12-25 DIAGNOSIS — F418 Other specified anxiety disorders: Secondary | ICD-10-CM | POA: Diagnosis not present

## 2017-12-25 DIAGNOSIS — Z23 Encounter for immunization: Secondary | ICD-10-CM

## 2017-12-25 MED ORDER — ESCITALOPRAM OXALATE 20 MG PO TABS: 20.0000 mg | ORAL_TABLET | Freq: Every day | ORAL | 0 refills | Status: DC

## 2017-12-25 NOTE — Telephone Encounter (Signed)
Mrs [name] Rojas is seeing the Dr today --and wanted to know if you would send the note today before 11 as that is her Appt time... Regarding Valum

## 2017-12-25 NOTE — Patient Instructions (Signed)
Stay as active as you can manage No change in medicine today I will send a e-mail to Dr Vanetta ShawlHisada about the depression management I will contact Zetta BillsAyesha Powell about the pain management Call your insurance about the shingles shot  Due for lab tests I will send you a letter with your test results.  If there is anything of concern, we will call right away.  See me in 3 months

## 2017-12-25 NOTE — Telephone Encounter (Signed)
Will send now
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
## 2017-12-25 NOTE — Progress Notes (Signed)
Chief Complaint  Patient presents with  . Follow-up   Patient is here for follow-up.  No recent accident illness or fall.  She requests testing for "diabetes" because she has increased numbness in hands and feet. She is under the care of pain medicine for chronic pain.  She takes hydrocodone daily.  She is also on gabapentin.  She requests a prescription for tramadol.  I told her that she needs to discuss pain medicine with her pain management doctor.  She states "nothing works". Patient is under the care of psychiatry for her depression and anxiety.  She previously took benzodiazepines.  She asks at every visit for Valium ,"just a few".  I have told her on each occasion that her psychiatrist will prescribe her depression anxiety medicine.  I have told her further that I do not think Valium or this type of drug is a good choice for her given her age, and daily use of hydrocodone. She has gained a few pounds.  She is unhappy about this.  She thinks she needs to weight under 150.  She is at 156.  Her BMI is 26.  I explained to her that she is not overweight,, and her goal should be to maintain good health and not worry about a few pounds.  I do recommend daily exercise which she states that she does. Patient Active Problem List   Diagnosis Date Noted  . Major depressive disorder, recurrent episode, mild with anxious distress (HCC) 09/25/2017  . Major depressive disorder, recurrent episode, moderate (HCC) 07/12/2017  . Mild neurocognitive disorder 07/12/2017  . Vitamin D deficiency 06/27/2017  . Osteoporosis 06/27/2017  . Fibromyalgia 03/30/2017  . History of medication noncompliance 03/30/2017  . Osteoporosis of lower leg associated with endocrine disorder 03/30/2017  . Primary osteoarthritis of left shoulder 04/11/2016  . Hearing difficulty 09/17/2015  . Neuropathic pain 04/10/2013  . Anxiety disorder 04/10/2013  . Failed back syndrome 08/24/2012  . Pulmonary hypertension (HCC) 04/25/2012   . Congenital heart disease with intracardiac shunting 04/25/2012  . History of stroke 05/23/2011  . Status post total knee replacement 02/03/2010  . GERD (gastroesophageal reflux disease) 02/03/2010  . Chronic pain 02/02/2010  . Essential hypertension 01/15/2010  . Status post bariatric surgery 04/30/2009    Outpatient Encounter Medications as of 12/25/2017  Medication Sig  . alendronate (FOSAMAX) 70 MG tablet Take 1 tablet (70 mg total) by mouth every 7 (seven) days. Take with a full glass of water on an empty stomach.  Marland Kitchen. aspirin EC 81 MG tablet Take 1 tablet (81 mg total) by mouth daily.  Marland Kitchen. CALCIUM CITRATE-VITAMIN D PO Take by mouth.  . calcium-vitamin D (OSCAL WITH D) 500-200 MG-UNIT tablet Take 1 tablet by mouth daily with breakfast.  . clobetasol ointment (TEMOVATE) 0.05 % Apply to vaginal area twice a day for 2 weeks, then as needed for discomfort  . gabapentin (NEURONTIN) 100 MG capsule Take 300 mg by mouth 3 (three) times daily.   . hydrochlorothiazide (HYDRODIURIL) 25 MG tablet Take 1 tablet (25 mg total) by mouth daily.  Marland Kitchen. HYDROcodone-acetaminophen (NORCO) 10-325 MG tablet   . hydrOXYzine (ATARAX/VISTARIL) 25 MG tablet   . IRON PO Take by mouth.  . loratadine (CLARITIN) 10 MG tablet Take 10 mg by mouth daily.  . mirabegron ER (MYRBETRIQ) 25 MG TB24 tablet Take 1 tablet (25 mg total) by mouth daily.  Marland Kitchen. omeprazole (PRILOSEC) 40 MG capsule TAKE 1 CAPSULE BY MOUTH EVERY DAY.  Marland Kitchen. potassium chloride (  KLOR-CON M10) 10 MEQ tablet Take 1 tablet (10 mEq total) by mouth daily.  Marland Kitchen tiZANidine (ZANAFLEX) 4 MG tablet Take 1 tablet (4 mg total) by mouth at bedtime.   No facility-administered encounter medications on file as of 12/25/2017.     Allergies  Allergen Reactions  . Codeine Nausea And Vomiting  . Lyrica [Pregabalin]     swelling    Review of Systems  Constitutional: Positive for unexpected weight change. Negative for activity change and appetite change.  HENT: Negative for  congestion, dental problem, postnasal drip and rhinorrhea.   Eyes: Negative for redness and visual disturbance.  Respiratory: Negative for cough and shortness of breath.   Cardiovascular: Negative for chest pain, palpitations and leg swelling.  Gastrointestinal: Negative for abdominal pain, constipation and diarrhea.  Genitourinary: Negative for difficulty urinating and frequency.  Musculoskeletal: Positive for back pain. Negative for arthralgias.       Muscle cramps  Neurological: Negative for dizziness and headaches.  Psychiatric/Behavioral: Positive for sleep disturbance. Negative for confusion and dysphoric mood. The patient is nervous/anxious.     BP (!) 144/80   Pulse 63   Temp 98.7 F (37.1 C) (Other (Comment))   Resp 16   Ht 5\' 5"  (1.651 m)   Wt 156 lb 8 oz (71 kg)   SpO2 98%   BMI 26.04 kg/m   Physical Exam  Constitutional: She is oriented to person, place, and time. She appears well-developed and well-nourished. No distress.  HENT:  Head: Normocephalic and atraumatic.  Mouth/Throat: Oropharynx is clear and moist.  Eyes: EOM are normal. Pupils are equal, round, and reactive to light.  Neck: Normal range of motion. No thyromegaly present.  Cardiovascular: Normal rate, regular rhythm and normal heart sounds.  Pulmonary/Chest: Effort normal and breath sounds normal.  Abdominal: Soft. Bowel sounds are normal. There is no tenderness.  Neurological: She is alert and oriented to person, place, and time.  Skin: Skin is warm and dry.  Psychiatric: Her behavior is normal. Thought content normal.  Mildly anxious    ASSESSMENT/PLAN:  1. Essential hypertension - COMPLETE METABOLIC PANEL WITH GFR - Lipid panel - TSH - Urinalysis, Routine w reflex microscopic  2. Other chronic pain  3. Vitamin D deficiency disease - VITAMIN D 25 Hydroxy (Vit-D Deficiency, Fractures) 4.  Depression ,anxiety    Patient Instructions  Stay as active as you can manage No change in  medicine today I will send a e-mail to Dr Vanetta Shawl about the depression management I will contact Zetta Bills about the pain management Call your insurance about the shingles shot  Due for lab tests I will send you a letter with your test results.  If there is anything of concern, we will call right away.  See me in 3 months     Eustace Moore, MD

## 2017-12-25 NOTE — Patient Instructions (Signed)
1. Continue lexapro 20 mg daily  2. Return to clinic in three months for 15mins 

## 2017-12-26 ENCOUNTER — Ambulatory Visit: Payer: Self-pay | Admitting: Family Medicine

## 2017-12-26 ENCOUNTER — Ambulatory Visit (HOSPITAL_COMMUNITY): Payer: Self-pay | Admitting: Psychiatry

## 2018-01-03 ENCOUNTER — Ambulatory Visit (HOSPITAL_COMMUNITY): Payer: Self-pay | Admitting: Licensed Clinical Social Worker

## 2018-01-04 DIAGNOSIS — M797 Fibromyalgia: Secondary | ICD-10-CM | POA: Diagnosis not present

## 2018-01-04 DIAGNOSIS — M130 Polyarthritis, unspecified: Secondary | ICD-10-CM | POA: Diagnosis not present

## 2018-01-04 DIAGNOSIS — M545 Low back pain: Secondary | ICD-10-CM | POA: Diagnosis not present

## 2018-01-04 DIAGNOSIS — Z79891 Long term (current) use of opiate analgesic: Secondary | ICD-10-CM | POA: Diagnosis not present

## 2018-02-26 ENCOUNTER — Ambulatory Visit: Payer: Self-pay | Admitting: Family Medicine

## 2018-03-01 DIAGNOSIS — M797 Fibromyalgia: Secondary | ICD-10-CM | POA: Diagnosis not present

## 2018-03-01 DIAGNOSIS — M130 Polyarthritis, unspecified: Secondary | ICD-10-CM | POA: Diagnosis not present

## 2018-03-01 DIAGNOSIS — Z79891 Long term (current) use of opiate analgesic: Secondary | ICD-10-CM | POA: Diagnosis not present

## 2018-03-01 DIAGNOSIS — M545 Low back pain: Secondary | ICD-10-CM | POA: Diagnosis not present

## 2018-03-06 ENCOUNTER — Encounter (HOSPITAL_COMMUNITY): Payer: Self-pay | Admitting: Emergency Medicine

## 2018-03-06 ENCOUNTER — Emergency Department (HOSPITAL_COMMUNITY)
Admission: EM | Admit: 2018-03-06 | Discharge: 2018-03-06 | Disposition: A | Discharge status: Home / Self Care | Payer: Medicare Other | Attending: Emergency Medicine | Admitting: Emergency Medicine

## 2018-03-06 ENCOUNTER — Other Ambulatory Visit: Payer: Self-pay

## 2018-03-06 DIAGNOSIS — G8929 Other chronic pain: Secondary | ICD-10-CM | POA: Diagnosis not present

## 2018-03-06 DIAGNOSIS — M545 Low back pain: Secondary | ICD-10-CM | POA: Diagnosis present

## 2018-03-06 DIAGNOSIS — M5441 Lumbago with sciatica, right side: Secondary | ICD-10-CM | POA: Insufficient documentation

## 2018-03-06 DIAGNOSIS — M5442 Lumbago with sciatica, left side: Secondary | ICD-10-CM | POA: Diagnosis not present

## 2018-03-06 MED ORDER — NAPROXEN 500 MG PO TABS: 500.0000 mg | ORAL_TABLET | Freq: Two times a day (BID) | ORAL | 0 refills | Status: AC

## 2018-03-06 NOTE — Discharge Instructions (Signed)
Please follow-up with your pain management doctor You can add Aleve (Naproxen) twice a day for extra pain relief.

## 2018-03-06 NOTE — ED Provider Notes (Signed)
Medical screening examination/treatment/procedure(s) were conducted as a shared visit with non-physician practitioner(s) and myself.  I personally evaluated the patient during the encounter.   EKG Interpretation None       Patient seen by me along with the physician assistant.  Patient has long-standing chronic back pain.  Followed by pain management.  Actually saw them just on Thursday.  Some changes in her medications have resulted in some increased lumbar back pain that radiates into her upper part of her right leg.  Some of the symptoms been present at least for 2 weeks.  Patient is here wanting additional pain medicine.  Patient counseled that since she is followed by pain management she would need to call them for any adjustments in her meds.  Patient nontoxic no acute distress.  Ambulates fine.  No obvious acute focal deficits.  Patient with good range of motion at the back.  Did recommend that we could try some lidocaine patches.  Patient states she is used those in the past that usually have not helped but she will try them again and she will call pain management.   Vanetta MuldersZackowski, Kenzly Rogoff, MD 03/06/18 [id number]

## 2018-03-06 NOTE — ED Triage Notes (Signed)
Pt c/o arthritic back pain X 3-4 years. States increased tingling in arms and legs past two weeks. No OTC today, took Tramadol with no relief.

## 2018-03-07 ENCOUNTER — Other Ambulatory Visit: Payer: Self-pay

## 2018-03-26 ENCOUNTER — Other Ambulatory Visit: Payer: Self-pay | Admitting: Family Medicine

## 2018-03-27 ENCOUNTER — Other Ambulatory Visit (HOSPITAL_COMMUNITY): Payer: Self-pay | Admitting: Psychiatry

## 2018-03-27 ENCOUNTER — Other Ambulatory Visit (HOSPITAL_COMMUNITY): Payer: Self-pay | Admitting: *Deleted

## 2018-03-27 MED ORDER — ESCITALOPRAM OXALATE 20 MG PO TABS: 20.0000 mg | ORAL_TABLET | Freq: Every day | ORAL | 0 refills | Status: DC

## 2018-04-17 DIAGNOSIS — M1990 Unspecified osteoarthritis, unspecified site: Secondary | ICD-10-CM | POA: Diagnosis not present

## 2018-04-17 DIAGNOSIS — M545 Low back pain: Secondary | ICD-10-CM | POA: Diagnosis not present

## 2018-04-17 DIAGNOSIS — G9009 Other idiopathic peripheral autonomic neuropathy: Secondary | ICD-10-CM | POA: Diagnosis not present

## 2018-04-17 DIAGNOSIS — I10 Essential (primary) hypertension: Secondary | ICD-10-CM | POA: Diagnosis not present

## 2018-04-25 NOTE — Progress Notes (Signed)
BH MD/PA/NP OP Progress Note  04/30/2018 9:59 AM [name] Rojas  MRN:  [id number]  Chief Complaint:  Chief Complaint    Anxiety; Follow-up; Depression     HPI:  Patient presents for follow-up appointment for depression and anxiety.  She states that she has been feeling the same.  She occasionally takes Lexapro up to twice a day as she feels more depressed later in the day.  She understands the potential adverse reaction and agrees to stay on dose as instructed. She has been going to senior citizen and plays bingo and works on treadmill. She limits interaction to certain people as she occasionally feels used by them by offering them some money. She talks about her seven children and her husband, who left the family. She tries to end the phone conversation when her children starts some argument. She feels good when her children celebrated her for birthday and mother's day. She has a concern about her sister, who was recently admitted to the hospital and her mother, age 76, who lives in [place]. She tries to think that there is nothing she can do, although she is frustrated with the care for her mother at the facility. She denies  Insomnia.  She feels fatigued and depressed at times.  She has fair concentration.  She denies SI. she feels anxious and tense at times. . She rarely has panic attacks. She has short term memory loss; forgets where she put her keys. She makes sure to make list of grocery and write down the time she took her medication.   Wt Readings from Last 3 Encounters:  04/30/18 169 lb (76.7 kg)  03/06/18 167 lb (75.8 kg)  12/25/17 144 lb (65.3 kg)    Visit Diagnosis:    ICD-10-CM   1. Major depressive disorder, recurrent episode, mild with anxious distress (HCC) F33.0     Past Psychiatric History:  I have reviewed the patient's psychiatry history in detail and updated the patient record. Outpatient: in 2013 Psychiatry admission: denies Previous suicide attempt: denies Past  trials of medication: sertraline, Effexor, duloxetine,  Wellbutrin, Buspar,  History of violence: denies   Past Medical History:  Past Medical History:  Diagnosis Date  . Anemia   . Anxiety   . Arthritis   . Chronic back pain   . Depression   . Fibromyalgia   . GERD (gastroesophageal reflux disease)   . Hypertension   . Osteoporosis   . Stroke [facility])     Past Surgical History:  Procedure Laterality Date  . ABDOMINAL HYSTERECTOMY     bleeding  . BACK SURGERY    . GASTRIC BYPASS    . KNEE SURGERY    . TONSILLECTOMY      Family Psychiatric History: I have reviewed the patient's family history in detail and updated the patient record.  Family History:  Family History  Problem Relation Age of Onset  . Arthritis Mother   . Asthma Mother   . Heart disease Mother   . Early death Father        trauma  . Heart disease Sister   . Stroke Sister     Social History:  Social History   Socioeconomic History  . Marital status: Divorced    Spouse name: Not on file  . Number of children: 7  . Years of education: Not on file  . Highest education level: GED or equivalent  Occupational History  . Occupation: retired  Engineer, production  . Financial resource strain: Not very  hard  . Food insecurity:    Worry: Never true    Inability: Never true  . Transportation needs:    Medical: No    Non-medical: No  Tobacco Use  . Smoking status: Never Smoker  . Smokeless tobacco: Never Used  Substance and Sexual Activity  . Alcohol use: No  . Drug use: No  . Sexual activity: Not Currently  Lifestyle  . Physical activity:    Days per week: 1 day    Minutes per session: 30 min  . Stress: Not at all  Relationships  . Social connections:    Talks on phone: More than three times a week    Gets together: Twice a week    Attends religious service: More than 4 times per year    Active member of club or organization: No    Attends meetings of clubs or organizations: Never    Relationship  status: Divorced  Other Topics Concern  . Not on file  Social History Narrative  . Not on file    Allergies:  Allergies  Allergen Reactions  . Codeine Nausea And Vomiting  . Lyrica [Pregabalin]     swelling    Metabolic Disorder Labs: No results found for: HGBA1C, MPG No results found for: PROLACTIN Lab Results  Component Value Date   CHOL 150 04/24/2017   TRIG 55 04/24/2017   HDL 69 04/24/2017   CHOLHDL 2.2 04/24/2017   VLDL 11 04/24/2017   LDLCALC 70 04/24/2017   Lab Results  Component Value Date   TSH 0.67 04/25/2017    Therapeutic Level Labs: No results found for: LITHIUM No results found for: VALPROATE No components found for:  CBMZ  Current Medications: Current Outpatient Medications  Medication Sig Dispense Refill  . alendronate (FOSAMAX) 70 MG tablet Take 1 tablet (70 mg total) by mouth every 7 (seven) days. Take with a full glass of water on an empty stomach. 12 tablet 3  . aspirin EC 81 MG tablet Take 1 tablet (81 mg total) by mouth daily. 90 tablet 3  . CALCIUM CITRATE-VITAMIN D PO Take by mouth.    . calcium-vitamin D (OSCAL WITH D) 500-200 MG-UNIT tablet Take 1 tablet by mouth daily with breakfast. 90 tablet 3  . clobetasol ointment (TEMOVATE) 0.05 % Apply to vaginal area twice a day for 2 weeks, then as needed for discomfort 30 g 6  . escitalopram (LEXAPRO) 20 MG tablet Take 1 tablet (20 mg total) by mouth daily. 90 tablet 0  . gabapentin (NEURONTIN) 100 MG capsule Take 300 mg by mouth 3 (three) times daily.     . hydrochlorothiazide (HYDRODIURIL) 25 MG tablet Take 1 tablet (25 mg total) by mouth daily. 90 tablet 3  . HYDROcodone-acetaminophen (NORCO) 10-325 MG tablet     . hydrOXYzine (ATARAX/VISTARIL) 25 MG tablet     . IRON PO Take by mouth.    . loratadine (CLARITIN) 10 MG tablet Take 10 mg by mouth daily.    . mirabegron ER (MYRBETRIQ) 25 MG TB24 tablet Take 1 tablet (25 mg total) by mouth daily. 30 tablet 11  . naproxen (NAPROSYN) 500 MG tablet  Take 1 tablet (500 mg total) by mouth 2 (two) times daily. 30 tablet 0  . omeprazole (PRILOSEC) 40 MG capsule TAKE 1 CAPSULE BY MOUTH EVERY DAY. 90 capsule 3  . potassium chloride (KLOR-CON M10) 10 MEQ tablet Take 1 tablet (10 mEq total) by mouth daily. 90 tablet 3  . tiZANidine (ZANAFLEX) 4 MG tablet  Take 1 tablet (4 mg total) by mouth at bedtime. 30 tablet 11   No current facility-administered medications for this visit.      Musculoskeletal: Strength & Muscle Tone: within normal limits Gait & Station: normal Patient leans: N/A  Psychiatric Specialty Exam: Review of Systems  Psychiatric/Behavioral: Positive for depression and memory loss. Negative for hallucinations, substance abuse and suicidal ideas. The patient is nervous/anxious. The patient does not have insomnia.   All other systems reviewed and are negative.   Blood pressure (!) 144/84, pulse 67, height  (1.651 m), weight 169 lb (76.7 kg), SpO2 98 %.Body mass index is 28.12 kg/m.  General Appearance: Fairly Groomed  Eye Contact:  Good  Speech:  Clear and Coherent  Volume:  Normal  Mood:  "fine"  Affect:  Appropriate, Congruent and less down, reactive  Thought Process:  Coherent  Orientation:  Full (Time, Place, and Person)  Thought Content: Logical   Suicidal Thoughts:  No  Homicidal Thoughts:  No  Memory:  Immediate;   Good  Judgement:  Good  Insight:  Fair  Psychomotor Activity:  Normal  Concentration:  Concentration: Good and Attention Span: Good  Recall:  Good  Fund of Knowledge: Good  Language: Good  Akathisia:  No  Handed:  Right  AIMS (if indicated): not done  Assets:  Communication Skills Desire for Improvement  ADL's:  Intact  Cognition: WNL  Sleep:  Good   Screenings: PHQ2-9     Office Visit from 12/25/2017 in [facility] Clinical Support from 12/04/2017 in [facility] Office Visit from 11/13/2017 in [facility] Office Visit from 06/27/2017 in [place]  [facility] Care Office Visit from 03/30/2017 in [facility]  PHQ-2 Total Score  4  0  0  6  0  PHQ-9 Total Score  9  -  -  14  -     MOCA 20/30 on 07/12/2017 (-4 for visuospatial/executive, -1 for naming, -5 for delayed recall)  Assessment and Plan:  [name] Rojas is a 77 y.o. year old female with a history of depression, anxiety,  fibromyalgia,hypertension, pulmonary hypertension, h/o stroke, GERD, s/p bariatric surgery, who presents for follow up appointment for Major depressive disorder, recurrent episode, mild with anxious distress (HCC)  # MDD, moderate, recurrent without psychotic features # Unspecified anxiety disorder Exam is notable for less rumination on pain and anxiety and patient did not request any benzodiazepine as she used to. Will continue lexapro to target depression. Discussed with patient that it is recommended to use lower dose (10 mg) for geriatric patient; she agrees to taper down in the future, if she continues to experience less mood symptoms. Discussed behavioral activation and cognitive defusion.   # Neurocognitive deficits She complains of memory loss and she does have cognitive deficits as in MOCA. ADL/IADL independent. Labs with no treatable cause of dementia. Will continue to monitor.    Plan I have reviewed and updated plans as below 1. Continue lexapro 20 mg daily (QTc 411 msec 06/2017) 2. Return to clinic in three months for 15 mins - She is advised to see Mr. Sheets for therapy  The duration of this appointment visit was 30 minutes of face-to-face time with the patient.  Greater than 50% of this time was spent in counseling, explanation of  diagnosis, planning of further management, and coordination of care.  Neysa Hotter, MD 04/30/2018, 9:59 AM

## 2018-04-27 DIAGNOSIS — E559 Vitamin D deficiency, unspecified: Secondary | ICD-10-CM | POA: Diagnosis not present

## 2018-04-27 DIAGNOSIS — D519 Vitamin B12 deficiency anemia, unspecified: Secondary | ICD-10-CM | POA: Diagnosis not present

## 2018-04-27 DIAGNOSIS — E782 Mixed hyperlipidemia: Secondary | ICD-10-CM | POA: Diagnosis not present

## 2018-04-30 ENCOUNTER — Encounter (HOSPITAL_COMMUNITY): Payer: Self-pay | Admitting: Psychiatry

## 2018-04-30 ENCOUNTER — Ambulatory Visit (INDEPENDENT_AMBULATORY_CARE_PROVIDER_SITE_OTHER): Payer: Medicare Other | Admitting: Psychiatry

## 2018-04-30 VITALS — BP 144/84 | HR 67 | Ht 65.0 in | Wt 169.0 lb

## 2018-04-30 DIAGNOSIS — F330 Major depressive disorder, recurrent, mild: Secondary | ICD-10-CM | POA: Diagnosis not present

## 2018-04-30 DIAGNOSIS — R419 Unspecified symptoms and signs involving cognitive functions and awareness: Secondary | ICD-10-CM | POA: Diagnosis not present

## 2018-04-30 DIAGNOSIS — F419 Anxiety disorder, unspecified: Secondary | ICD-10-CM

## 2018-04-30 DIAGNOSIS — R450 Nervousness: Secondary | ICD-10-CM | POA: Diagnosis not present

## 2018-04-30 MED ORDER — ESCITALOPRAM OXALATE 20 MG PO TABS: 20.0000 mg | ORAL_TABLET | Freq: Every day | ORAL | 0 refills | Status: DC

## 2018-04-30 NOTE — Patient Instructions (Signed)
1. Continue lexapro 20 mg daily  2. Return to clinic in three months for 15mins 

## 2018-05-02 DIAGNOSIS — Z0000 Encounter for general adult medical examination without abnormal findings: Secondary | ICD-10-CM | POA: Diagnosis not present

## 2018-05-02 DIAGNOSIS — M1990 Unspecified osteoarthritis, unspecified site: Secondary | ICD-10-CM | POA: Diagnosis not present

## 2018-05-02 DIAGNOSIS — M545 Low back pain: Secondary | ICD-10-CM | POA: Diagnosis not present

## 2018-05-02 DIAGNOSIS — J309 Allergic rhinitis, unspecified: Secondary | ICD-10-CM | POA: Diagnosis not present

## 2018-05-02 DIAGNOSIS — I10 Essential (primary) hypertension: Secondary | ICD-10-CM | POA: Diagnosis not present

## 2018-05-09 DIAGNOSIS — M130 Polyarthritis, unspecified: Secondary | ICD-10-CM | POA: Diagnosis not present

## 2018-05-09 DIAGNOSIS — M797 Fibromyalgia: Secondary | ICD-10-CM | POA: Diagnosis not present

## 2018-05-09 DIAGNOSIS — M545 Low back pain: Secondary | ICD-10-CM | POA: Diagnosis not present

## 2018-05-09 DIAGNOSIS — Z79891 Long term (current) use of opiate analgesic: Secondary | ICD-10-CM | POA: Diagnosis not present

## 2018-05-09 NOTE — ED Provider Notes (Signed)
[facility] EMERGENCY DEPARTMENT Provider Note   CSN: [id number] Arrival date & time: 03/06/18  1304     History   Chief Complaint Chief Complaint  Patient presents with  . Back Pain    HPI [name] Rojas is a 77 y.o. female who presents with back pain. PMH significant for chronic back pain, osteoporosis, HTN, fibromyalgia. She states that her back pain has been worsening over the past two weeks. She denies injury. The pain is over the right side of her back and radiates in to her right leg. The pain is constant. Nothing makes it better. She tried Tramadol and muscle relaxers without relief. She goes to pain management as well. No fever, syncope, trauma, unexplained weight loss, hx of cancer, loss of bowel/bladder function, saddle anesthesia, urinary retention, IVDU.  HPI  Past Medical History:  Diagnosis Date  . Anemia   . Anxiety   . Arthritis   . Chronic back pain   . Depression   . Fibromyalgia   . GERD (gastroesophageal reflux disease)   . Hypertension   . Osteoporosis   . Stroke [facility])     Patient Active Problem List   Diagnosis Date Noted  . Major depressive disorder, recurrent episode, mild with anxious distress (HCC) 09/25/2017  . Mild neurocognitive disorder 07/12/2017  . Vitamin D deficiency 06/27/2017  . Osteoporosis 06/27/2017  . Fibromyalgia 03/30/2017  . History of medication noncompliance 03/30/2017  . Osteoporosis of lower leg associated with endocrine disorder 03/30/2017  . Primary osteoarthritis of left shoulder 04/11/2016  . Hearing difficulty 09/17/2015  . Neuropathic pain 04/10/2013  . Anxiety disorder 04/10/2013  . Failed back syndrome 08/24/2012  . Pulmonary hypertension (HCC) 04/25/2012  . Congenital heart disease with intracardiac shunting 04/25/2012  . History of stroke 05/23/2011  . Status post total knee replacement 02/03/2010  . GERD (gastroesophageal reflux disease) 02/03/2010  . Chronic pain 02/02/2010  . Essential hypertension  01/15/2010  . Status post bariatric surgery 04/30/2009    Past Surgical History:  Procedure Laterality Date  . ABDOMINAL HYSTERECTOMY     bleeding  . BACK SURGERY    . GASTRIC BYPASS    . KNEE SURGERY    . TONSILLECTOMY       OB History   None      Home Medications    Prior to Admission medications   Medication Sig Start Date End Date Taking? Authorizing Provider  alendronate (FOSAMAX) 70 MG tablet Take 1 tablet (70 mg total) by mouth every 7 (seven) days. Take with a full glass of water on an empty stomach. 08/03/17   Eustace Moore, MD  aspirin EC 81 MG tablet Take 1 tablet (81 mg total) by mouth daily. 08/03/17   Eustace Moore, MD  CALCIUM CITRATE-VITAMIN D PO Take by mouth.    [provider]  calcium-vitamin D (OSCAL WITH D) 500-200 MG-UNIT tablet Take 1 tablet by mouth daily with breakfast. 08/03/17   Eustace Moore, MD  clobetasol ointment (TEMOVATE) 0.05 % Apply to vaginal area twice a day for 2 weeks, then as needed for discomfort 11/13/17   Eustace Moore, MD  escitalopram (LEXAPRO) 20 MG tablet Take 1 tablet (20 mg total) by mouth daily. 04/30/18   Neysa Hotter, MD  gabapentin (NEURONTIN) 100 MG capsule Take 300 mg by mouth 3 (three) times daily.     [provider]  hydrochlorothiazide (HYDRODIURIL) 25 MG tablet Take 1 tablet (25 mg total) by mouth daily. 08/03/17   Delton See,  Letta Pate, MD  HYDROcodone-acetaminophen [facility]) 10-325 MG tablet  11/13/17   [provider]  hydrOXYzine (ATARAX/VISTARIL) 25 MG tablet  07/24/17   [provider]  IRON PO Take by mouth.    [provider]  loratadine (CLARITIN) 10 MG tablet Take 10 mg by mouth daily.    [provider]  mirabegron ER (MYRBETRIQ) 25 MG TB24 tablet Take 1 tablet (25 mg total) by mouth daily. 11/13/17   Eustace Moore, MD  naproxen (NAPROSYN) 500 MG tablet Take 1 tablet (500 mg total) by mouth 2 (two) times daily. 03/06/18   Bethel Born,  PA-C  omeprazole (PRILOSEC) 40 MG capsule TAKE 1 CAPSULE BY MOUTH EVERY DAY. 10/31/17   Eustace Moore, MD  potassium chloride (KLOR-CON M10) 10 MEQ tablet Take 1 tablet (10 mEq total) by mouth daily. 08/03/17   Eustace Moore, MD  tiZANidine (ZANAFLEX) 4 MG tablet Take 1 tablet (4 mg total) by mouth at bedtime. 06/27/17   Eustace Moore, MD    Family History Family History  Problem Relation Age of Onset  . Arthritis Mother   . Asthma Mother   . Heart disease Mother   . Early death Father        trauma  . Heart disease Sister   . Stroke Sister     Social History Social History   Tobacco Use  . Smoking status: Never Smoker  . Smokeless tobacco: Never Used  Substance Use Topics  . Alcohol use: No  . Drug use: No     Allergies   Codeine and Lyrica [pregabalin]   Review of Systems Review of Systems  Musculoskeletal: Positive for back pain.  Neurological: Negative for weakness.     Physical Exam Updated Vital Signs BP (!) 151/103 (BP Location: Left Arm)   Pulse (!) 105   Temp 98.6 F (37 C) (Oral)   Resp 18   Wt 75.8 kg (167 lb)   SpO2 97%   BMI 27.79 kg/m   Physical Exam  Constitutional: She is oriented to person, place, and time. She appears well-developed and well-nourished. No distress.  HENT:  Head: Normocephalic and atraumatic.  Eyes: Pupils are equal, round, and reactive to light. Conjunctivae are normal. Right eye exhibits no discharge. Left eye exhibits no discharge. No scleral icterus.  Neck: Normal range of motion.  Cardiovascular: Normal rate.  Pulmonary/Chest: Effort normal. No respiratory distress.  Abdominal: She exhibits no distension.  Musculoskeletal:  Back: Inspection: No masses, deformity, or rash Palpation: Mild lumbar midline spinal tenderness with right sided paraspinal muscle tenderness. ROM: Normal flexion, extension, lateral rotation and flexion of back.  Strength: 5/5 in lower extremities and normal plantar and  dorsiflexion Sensation: Intact sensation with light touch in lower extremities bilaterally Reflexes: Patellar reflex is 2+ bilaterally SLR: Negative seated straight leg raise Gait: Normal gait   Neurological: She is alert and oriented to person, place, and time.  Skin: Skin is warm and dry.  Psychiatric: She has a normal mood and affect. Her behavior is normal.  Nursing note and vitals reviewed.    ED Treatments / Results  Labs (all labs ordered are listed, but only abnormal results are displayed) Labs Reviewed - No data to display  EKG None  Radiology No results found.  Procedures Procedures (including critical care time)  Medications Ordered in ED Medications - No data to display   Initial Impression / Assessment and Plan / ED Course  I have reviewed the triage  vital signs and the nursing notes.  Pertinent labs & imaging results that were available during my care of the patient were reviewed by me and considered in my medical decision making (see chart for details).  77 year old with acute on chronic back pain. Vitals are normal. Exam is overall benign and she is ambulatory. She already goes to pain management. Shared visit with Dr. Deretha Emory. Will d/c with Naproxen and have her f/u with her doctor.  Final Clinical Impressions(s) / ED Diagnoses   Final diagnoses:  Chronic midline low back pain with bilateral sciatica    ED Discharge Orders        Ordered    naproxen (NAPROSYN) 500 MG tablet  2 times daily     03/06/18 1425       Bethel Born, PA-C 05/09/18 1013    Vanetta Mulders, MD 05/10/18 [phone]

## 2018-05-20 ENCOUNTER — Emergency Department (HOSPITAL_COMMUNITY)
Admission: EM | Admit: 2018-05-20 | Discharge: 2018-05-21 | Disposition: A | Discharge status: Home / Self Care | Payer: Medicare Other | Attending: Emergency Medicine | Admitting: Emergency Medicine

## 2018-05-20 ENCOUNTER — Encounter (HOSPITAL_COMMUNITY): Payer: Self-pay | Admitting: *Deleted

## 2018-05-20 DIAGNOSIS — Z79899 Other long term (current) drug therapy: Secondary | ICD-10-CM | POA: Insufficient documentation

## 2018-05-20 DIAGNOSIS — I10 Essential (primary) hypertension: Secondary | ICD-10-CM | POA: Diagnosis not present

## 2018-05-20 DIAGNOSIS — Z8673 Personal history of transient ischemic attack (TIA), and cerebral infarction without residual deficits: Secondary | ICD-10-CM | POA: Insufficient documentation

## 2018-05-20 DIAGNOSIS — R5381 Other malaise: Secondary | ICD-10-CM | POA: Diagnosis not present

## 2018-05-20 DIAGNOSIS — R252 Cramp and spasm: Secondary | ICD-10-CM | POA: Diagnosis not present

## 2018-05-20 DIAGNOSIS — Z9884 Bariatric surgery status: Secondary | ICD-10-CM | POA: Insufficient documentation

## 2018-05-20 DIAGNOSIS — Z7982 Long term (current) use of aspirin: Secondary | ICD-10-CM | POA: Insufficient documentation

## 2018-05-20 DIAGNOSIS — G4762 Sleep related leg cramps: Secondary | ICD-10-CM | POA: Diagnosis not present

## 2018-05-20 DIAGNOSIS — M79605 Pain in left leg: Secondary | ICD-10-CM | POA: Diagnosis present

## 2018-05-20 NOTE — ED Notes (Signed)
Pt reports history of leg cramps and she takes mustard for them   Tonight a cramp from toes to thigh and it is unrelieved by mustard  reprots that she felt that she cshould be evaluated due to it going higher than it had been

## 2018-05-20 NOTE — ED Triage Notes (Signed)
Pt with left leg pain that starts at toes and radiates up leg.

## 2018-05-20 NOTE — ED Notes (Signed)
Out of bed to BR 

## 2018-05-21 LAB — I-STAT CHEM 8, ED
BUN: 16 mg/dL (ref 6–20)
CALCIUM ION: 1.19 mmol/L (ref 1.15–1.40)
CHLORIDE: 105 mmol/L (ref 101–111)
Creatinine, Ser: 0.8 mg/dL (ref 0.44–1.00)
GLUCOSE: 99 mg/dL (ref 65–99)
HCT: 34 % — ABNORMAL LOW (ref 36.0–46.0)
Hemoglobin: 11.6 g/dL — ABNORMAL LOW (ref 12.0–15.0)
Potassium: 3.7 mmol/L (ref 3.5–5.1)
Sodium: 141 mmol/L (ref 135–145)
TCO2: 23 mmol/L (ref 22–32)

## 2018-05-21 NOTE — ED Provider Notes (Signed)
[facility] EMERGENCY DEPARTMENT Provider Note   CSN: [id number] Arrival date & time: 05/20/18  2055     History   Chief Complaint Chief Complaint  Patient presents with  . Leg Pain    left    HPI [name] Rojas is a 77 y.o. female.  The history is provided by the patient and a relative.  Leg Pain   This is a recurrent problem. The problem occurs constantly. The problem has been gradually improving. The pain is present in the left upper leg and left lower leg. The pain is moderate. The symptoms are aggravated by activity. Treatments tried: Mustard. The treatment provided no relief.   Patient with history of hypertension, depression, back pain presents with left leg pain and cramping.  She reports long history of cramps in her legs, usually resolves with mustard Tonight she began having leg cramps, she tried using mustard without any improvement.  No weakness reported.  No swelling in the leg.  No recent falls or trauma.  She was concerned that it may travel to her heart No h/o VTE Past Medical History:  Diagnosis Date  . Anemia   . Anxiety   . Arthritis   . Chronic back pain   . Depression   . Fibromyalgia   . GERD (gastroesophageal reflux disease)   . Hypertension   . Osteoporosis   . Stroke [facility])     Patient Active Problem List   Diagnosis Date Noted  . Major depressive disorder, recurrent episode, mild with anxious distress (HCC) 09/25/2017  . Mild neurocognitive disorder 07/12/2017  . Vitamin D deficiency 06/27/2017  . Osteoporosis 06/27/2017  . Fibromyalgia 03/30/2017  . History of medication noncompliance 03/30/2017  . Osteoporosis of lower leg associated with endocrine disorder 03/30/2017  . Primary osteoarthritis of left shoulder 04/11/2016  . Hearing difficulty 09/17/2015  . Neuropathic pain 04/10/2013  . Anxiety disorder 04/10/2013  . Failed back syndrome 08/24/2012  . Pulmonary hypertension (HCC) 04/25/2012  . Congenital heart disease with intracardiac  shunting 04/25/2012  . History of stroke 05/23/2011  . Status post total knee replacement 02/03/2010  . GERD (gastroesophageal reflux disease) 02/03/2010  . Chronic pain 02/02/2010  . Essential hypertension 01/15/2010  . Status post bariatric surgery 04/30/2009    Past Surgical History:  Procedure Laterality Date  . ABDOMINAL HYSTERECTOMY     bleeding  . BACK SURGERY    . GASTRIC BYPASS    . KNEE SURGERY    . TONSILLECTOMY       OB History   None      Home Medications    Prior to Admission medications   Medication Sig Start Date End Date Taking? Authorizing Provider  ascorbic acid (VITAMIN C) 500 MG tablet Take 1 tablet by mouth daily.   Yes [provider]  calcium-vitamin D (OSCAL WITH D) 500-200 MG-UNIT tablet Take 1 tablet by mouth daily with breakfast. 08/03/17  Yes Eustace Moore, MD  escitalopram (LEXAPRO) 20 MG tablet Take 1 tablet (20 mg total) by mouth daily. 04/30/18  Yes Hisada, Barbee Cough, MD  hydrochlorothiazide (HYDRODIURIL) 25 MG tablet Take 1 tablet (25 mg total) by mouth daily. 08/03/17  Yes Eustace Moore, MD  HYDROcodone-acetaminophen [facility]) 10-325 MG tablet Take 1 tablet by mouth every 6 (six) hours as needed for moderate pain.  11/13/17  Yes [provider]  IRON PO Take by mouth.   Yes [provider]  loratadine (CLARITIN) 10 MG tablet Take 10 mg by mouth daily.  Yes [provider]  naproxen (NAPROSYN) 500 MG tablet Take 1 tablet (500 mg total) by mouth 2 (two) times daily. 03/06/18  Yes Bethel Born, PA-C  omeprazole (PRILOSEC) 40 MG capsule TAKE 1 CAPSULE BY MOUTH EVERY DAY. 10/31/17  Yes Eustace Moore, MD  potassium chloride (KLOR-CON M10) 10 MEQ tablet Take 1 tablet (10 mEq total) by mouth daily. 08/03/17  Yes Eustace Moore, MD  tiZANidine (ZANAFLEX) 4 MG tablet Take 1 tablet (4 mg total) by mouth at bedtime. 06/27/17  Yes Eustace Moore, MD  traMADol (ULTRAM) 50 MG tablet Take 50 mg by mouth  daily as needed. 05/09/18  Yes [provider]  aspirin EC 81 MG tablet Take 1 tablet (81 mg total) by mouth daily. Patient taking differently: Take 81 mg by mouth as needed.  08/03/17   Eustace Moore, MD    Family History Family History  Problem Relation Age of Onset  . Arthritis Mother   . Asthma Mother   . Heart disease Mother   . Early death Father        trauma  . Heart disease Sister   . Stroke Sister     Social History Social History   Tobacco Use  . Smoking status: Never Smoker  . Smokeless tobacco: Never Used  Substance Use Topics  . Alcohol use: No  . Drug use: No     Allergies   Codeine; Iodinated diagnostic agents; Lyrica [pregabalin]; and Statins   Review of Systems Review of Systems  Constitutional: Negative for fever.  Respiratory: Negative for shortness of breath.   Cardiovascular: Negative for chest pain and leg swelling.  Musculoskeletal: Positive for arthralgias.  All other systems reviewed and are negative.    Physical Exam Updated Vital Signs BP (!) 137/91 (BP Location: Left Arm)   Pulse 92   Temp 99.3 F (37.4 C) (Oral)   Resp 18   Ht 1.626 m (5\' 4" )   Wt 77.1 kg (170 lb)   SpO2 98%   BMI 29.18 kg/m   Physical Exam CONSTITUTIONAL: Well developed/well nourished HEAD: Normocephalic/atraumatic EYES: EOMI ENMT: Mucous membranes moist NECK: supple no meningeal signs SPINE/BACK:entire spine nontender CV: S1/S2 noted, no murmurs/rubs/gallops noted LUNGS: Lungs are clear to auscultation bilaterally, no apparent distress ABDOMEN: soft, nontender NEURO: Pt is awake/alert/appropriate, moves all extremitiesx4.  No facial droop.   EXTREMITIES: pulses normal/equal, full ROM No lower extremity edema.  No calf tenderness.  Distal pulses equal intact.  No erythema.  No signs of cellulitis.  Full range of motion of both knees and hip. SKIN: warm, color normal PSYCH: no abnormalities of mood noted, alert and oriented to  situation   ED Treatments / Results  Labs (all labs ordered are listed, but only abnormal results are displayed) Labs Reviewed  I-STAT CHEM 8, ED - Abnormal; Notable for the following components:      Result Value   Hemoglobin 11.6 (*)    HCT 34.0 (*)    All other components within normal limits    EKG None  Radiology No results found.  Procedures Procedures (including critical care time)  Medications Ordered in ED Medications - No data to display   Initial Impression / Assessment and Plan / ED Course  I have reviewed the triage vital signs and the nursing notes.  Pertinent labs results that were available during my care of the patient were reviewed by me and considered in my medical decision making (see chart for details).  Labs reassuring.  No signs of DVT.  No signs of cellulitis.  She is neurovascularly intact.  Will discharge home  Final Clinical Impressions(s) / ED Diagnoses   Final diagnoses:  Leg cramps    ED Discharge Orders    None       Zadie RhineWickline, Imraan Wendell, MD 05/21/18 [phone]

## 2018-05-28 DIAGNOSIS — I10 Essential (primary) hypertension: Secondary | ICD-10-CM | POA: Diagnosis not present

## 2018-05-28 DIAGNOSIS — M545 Low back pain: Secondary | ICD-10-CM | POA: Diagnosis not present

## 2018-05-28 DIAGNOSIS — G9009 Other idiopathic peripheral autonomic neuropathy: Secondary | ICD-10-CM | POA: Diagnosis not present

## 2018-06-05 DIAGNOSIS — L600 Ingrowing nail: Secondary | ICD-10-CM | POA: Diagnosis not present

## 2018-06-05 DIAGNOSIS — L03031 Cellulitis of right toe: Secondary | ICD-10-CM | POA: Diagnosis not present

## 2018-06-05 DIAGNOSIS — M79674 Pain in right toe(s): Secondary | ICD-10-CM | POA: Diagnosis not present

## 2018-06-05 DIAGNOSIS — M79671 Pain in right foot: Secondary | ICD-10-CM | POA: Diagnosis not present

## 2018-06-10 ENCOUNTER — Other Ambulatory Visit: Payer: Self-pay

## 2018-06-10 ENCOUNTER — Observation Stay (HOSPITAL_COMMUNITY)
Admission: EM | Admit: 2018-06-10 | Discharge: 2018-06-12 | Disposition: A | Discharge status: Home Health | Payer: Medicare Other | Attending: Internal Medicine | Admitting: Internal Medicine

## 2018-06-10 ENCOUNTER — Emergency Department (HOSPITAL_COMMUNITY): Payer: Medicare Other

## 2018-06-10 ENCOUNTER — Encounter (HOSPITAL_COMMUNITY): Payer: Self-pay | Admitting: Emergency Medicine

## 2018-06-10 DIAGNOSIS — R41841 Cognitive communication deficit: Secondary | ICD-10-CM | POA: Diagnosis not present

## 2018-06-10 DIAGNOSIS — I10 Essential (primary) hypertension: Secondary | ICD-10-CM | POA: Diagnosis present

## 2018-06-10 DIAGNOSIS — M797 Fibromyalgia: Secondary | ICD-10-CM | POA: Diagnosis not present

## 2018-06-10 DIAGNOSIS — R269 Unspecified abnormalities of gait and mobility: Secondary | ICD-10-CM | POA: Diagnosis not present

## 2018-06-10 DIAGNOSIS — G8929 Other chronic pain: Secondary | ICD-10-CM | POA: Diagnosis present

## 2018-06-10 DIAGNOSIS — I639 Cerebral infarction, unspecified: Secondary | ICD-10-CM | POA: Diagnosis present

## 2018-06-10 DIAGNOSIS — G4489 Other headache syndrome: Secondary | ICD-10-CM | POA: Diagnosis not present

## 2018-06-10 DIAGNOSIS — E876 Hypokalemia: Secondary | ICD-10-CM | POA: Diagnosis not present

## 2018-06-10 DIAGNOSIS — Z8673 Personal history of transient ischemic attack (TIA), and cerebral infarction without residual deficits: Secondary | ICD-10-CM

## 2018-06-10 DIAGNOSIS — M810 Age-related osteoporosis without current pathological fracture: Secondary | ICD-10-CM | POA: Diagnosis present

## 2018-06-10 DIAGNOSIS — R51 Headache: Secondary | ICD-10-CM | POA: Diagnosis not present

## 2018-06-10 DIAGNOSIS — G459 Transient cerebral ischemic attack, unspecified: Secondary | ICD-10-CM

## 2018-06-10 DIAGNOSIS — R42 Dizziness and giddiness: Secondary | ICD-10-CM

## 2018-06-10 DIAGNOSIS — E785 Hyperlipidemia, unspecified: Secondary | ICD-10-CM | POA: Diagnosis not present

## 2018-06-10 DIAGNOSIS — R2681 Unsteadiness on feet: Secondary | ICD-10-CM

## 2018-06-10 DIAGNOSIS — R0602 Shortness of breath: Secondary | ICD-10-CM | POA: Insufficient documentation

## 2018-06-10 DIAGNOSIS — R0789 Other chest pain: Secondary | ICD-10-CM | POA: Insufficient documentation

## 2018-06-10 DIAGNOSIS — G4452 New daily persistent headache (NDPH): Principal | ICD-10-CM | POA: Insufficient documentation

## 2018-06-10 DIAGNOSIS — K219 Gastro-esophageal reflux disease without esophagitis: Secondary | ICD-10-CM | POA: Diagnosis present

## 2018-06-10 LAB — COMPREHENSIVE METABOLIC PANEL
ALBUMIN: 4 g/dL (ref 3.5–5.0)
ALK PHOS: 41 U/L (ref 38–126)
ALT: 23 U/L (ref 14–54)
ANION GAP: 8 (ref 5–15)
AST: 22 U/L (ref 15–41)
BUN: 19 mg/dL (ref 6–20)
CALCIUM: 9.4 mg/dL (ref 8.9–10.3)
CHLORIDE: 104 mmol/L (ref 101–111)
CO2: 27 mmol/L (ref 22–32)
Creatinine, Ser: 0.63 mg/dL (ref 0.44–1.00)
GFR calc Af Amer: >60 mL/min (ref >60)
GFR calc non Af Amer: >60 mL/min (ref >60)
GLUCOSE: 96 mg/dL (ref 65–99)
Potassium: 3.4 mmol/L — ABNORMAL LOW (ref 3.5–5.1)
SODIUM: 139 mmol/L (ref 135–145)
Total Bilirubin: 0.8 mg/dL (ref 0.3–1.2)
Total Protein: 6.4 g/dL — ABNORMAL LOW (ref 6.5–8.1)

## 2018-06-10 LAB — URINALYSIS, ROUTINE W REFLEX MICROSCOPIC
Bilirubin Urine: NEGATIVE
Glucose, UA: NEGATIVE mg/dL
Hgb urine dipstick: NEGATIVE
Ketones, ur: NEGATIVE mg/dL
LEUKOCYTES UA: NEGATIVE
NITRITE: NEGATIVE
PH: 6 (ref 5.0–8.0)
Protein, ur: NEGATIVE mg/dL
SPECIFIC GRAVITY, URINE: 1.009 (ref 1.005–1.030)

## 2018-06-10 LAB — CBC WITH DIFFERENTIAL/PLATELET
Basophils Absolute: 0 10*3/uL (ref 0.0–0.1)
Basophils Relative: 0 %
EOS ABS: 0.1 10*3/uL (ref 0.0–0.7)
EOS PCT: 2 %
HCT: 34.8 % — ABNORMAL LOW (ref 36.0–46.0)
HEMOGLOBIN: 11.3 g/dL — AB (ref 12.0–15.0)
Lymphocytes Relative: 36 %
Lymphs Abs: 1.7 10*3/uL (ref 0.7–4.0)
MCH: 33.7 pg (ref 26.0–34.0)
MCHC: 32.5 g/dL (ref 30.0–36.0)
MCV: 103.9 fL — AB (ref 78.0–100.0)
MONOS PCT: 11 %
Monocytes Absolute: 0.5 10*3/uL (ref 0.1–1.0)
Neutro Abs: 2.5 10*3/uL (ref 1.7–7.7)
Neutrophils Relative %: 51 %
Platelets: 201 10*3/uL (ref 150–400)
RBC: 3.35 MIL/uL — ABNORMAL LOW (ref 3.87–5.11)
RDW: 13.2 % (ref 11.5–15.5)
WBC: 4.8 10*3/uL (ref 4.0–10.5)

## 2018-06-10 LAB — PROTIME-INR
INR: 1.12
Prothrombin Time: 14.4 seconds (ref 11.4–15.2)

## 2018-06-10 LAB — I-STAT TROPONIN, ED: TROPONIN I, POC: 0.02 ng/mL (ref 0.00–0.08)

## 2018-06-10 LAB — RAPID URINE DRUG SCREEN, HOSP PERFORMED
AMPHETAMINES: NOT DETECTED
Benzodiazepines: NOT DETECTED
Cocaine: NOT DETECTED
OPIATES: POSITIVE — AB
Tetrahydrocannabinol: NOT DETECTED

## 2018-06-10 LAB — ETHANOL: Alcohol, Ethyl (B): <10 mg/dL (ref <10)

## 2018-06-10 LAB — APTT: APTT: 31 s (ref 24–36)

## 2018-06-10 MED ORDER — VITAMIN C 500 MG PO TABS
500.0000 mg | ORAL_TABLET | Freq: Every day | ORAL | Status: DC
  Administered 2018-06-11 – 2018-06-12 (×2): 500 mg via ORAL
  Filled 2018-06-10 (×5): qty 1

## 2018-06-10 MED ORDER — ACETAMINOPHEN 650 MG RE SUPP: 650.0000 mg | Freq: Four times a day (QID) | RECTAL | Status: DC | PRN

## 2018-06-10 MED ORDER — ACETAMINOPHEN 325 MG PO TABS
650.0000 mg | ORAL_TABLET | Freq: Four times a day (QID) | ORAL | Status: DC | PRN
  Administered 2018-06-11: 650 mg via ORAL
  Filled 2018-06-10: qty 2

## 2018-06-10 MED ORDER — POTASSIUM CHLORIDE CRYS ER 10 MEQ PO TBCR
10.0000 meq | EXTENDED_RELEASE_TABLET | Freq: Every day | ORAL | Status: DC
  Administered 2018-06-11 – 2018-06-12 (×2): 10 meq via ORAL
  Filled 2018-06-10 (×2): qty 1

## 2018-06-10 MED ORDER — ASPIRIN EC 325 MG PO TBEC
325.0000 mg | DELAYED_RELEASE_TABLET | Freq: Every day | ORAL | Status: DC
  Administered 2018-06-11 – 2018-06-12 (×2): 325 mg via ORAL
  Filled 2018-06-10 (×2): qty 1

## 2018-06-10 MED ORDER — ENOXAPARIN SODIUM 40 MG/0.4ML ~~LOC~~ SOLN
40.0000 mg | SUBCUTANEOUS | Status: DC
  Administered 2018-06-10 – 2018-06-11 (×2): 40 mg via SUBCUTANEOUS
  Filled 2018-06-10 (×2): qty 0.4

## 2018-06-10 MED ORDER — STROKE: EARLY STAGES OF RECOVERY BOOK
Freq: Once | Status: AC
  Administered 2018-06-11: 12:00:00
  Filled 2018-06-10 (×2): qty 1

## 2018-06-10 MED ORDER — LORATADINE 10 MG PO TABS
10.0000 mg | ORAL_TABLET | Freq: Every day | ORAL | Status: DC
  Administered 2018-06-11 – 2018-06-12 (×2): 10 mg via ORAL
  Filled 2018-06-10 (×2): qty 1

## 2018-06-10 MED ORDER — ESCITALOPRAM OXALATE 10 MG PO TABS
20.0000 mg | ORAL_TABLET | Freq: Every day | ORAL | Status: DC
  Administered 2018-06-11 – 2018-06-12 (×2): 20 mg via ORAL
  Filled 2018-06-10: qty 2
  Filled 2018-06-10: qty 1
  Filled 2018-06-10 (×3): qty 2

## 2018-06-10 MED ORDER — ONDANSETRON HCL 4 MG/2ML IJ SOLN: 4.0000 mg | Freq: Four times a day (QID) | INTRAMUSCULAR | Status: DC | PRN

## 2018-06-10 MED ORDER — VITAMIN D 1000 UNITS PO TABS
1000.0000 [IU] | ORAL_TABLET | Freq: Every day | ORAL | Status: DC
  Administered 2018-06-11 – 2018-06-12 (×2): 1000 [IU] via ORAL
  Filled 2018-06-10 (×5): qty 1

## 2018-06-10 MED ORDER — ONDANSETRON HCL 4 MG PO TABS: 4.0000 mg | ORAL_TABLET | Freq: Four times a day (QID) | ORAL | Status: DC | PRN

## 2018-06-10 MED ORDER — ATORVASTATIN CALCIUM 40 MG PO TABS
80.0000 mg | ORAL_TABLET | Freq: Every day | ORAL | Status: DC
  Administered 2018-06-10 – 2018-06-12 (×3): 80 mg via ORAL
  Filled 2018-06-10 (×3): qty 2

## 2018-06-10 MED ORDER — MECLIZINE HCL 12.5 MG PO TABS
12.5000 mg | ORAL_TABLET | Freq: Once | ORAL | Status: AC
  Administered 2018-06-10: 12.5 mg via ORAL
  Filled 2018-06-10: qty 1

## 2018-06-10 MED ORDER — HYDROCODONE-ACETAMINOPHEN 10-325 MG PO TABS
1.0000 | ORAL_TABLET | Freq: Four times a day (QID) | ORAL | Status: DC | PRN
  Administered 2018-06-10 – 2018-06-12 (×6): 1 via ORAL
  Filled 2018-06-10 (×6): qty 1

## 2018-06-10 MED ORDER — LACTATED RINGERS IV SOLN
INTRAVENOUS | Status: AC
  Administered 2018-06-10: 23:00:00 via INTRAVENOUS

## 2018-06-10 MED ORDER — CYANOCOBALAMIN 500 MCG PO TABS
ORAL_TABLET | Freq: Every day | ORAL | Status: DC
  Filled 2018-06-10: qty 0.5

## 2018-06-10 MED ORDER — HYDRALAZINE HCL 20 MG/ML IJ SOLN: 10.0000 mg | INTRAMUSCULAR | Status: DC | PRN

## 2018-06-10 MED ORDER — PANTOPRAZOLE SODIUM 40 MG PO TBEC
40.0000 mg | DELAYED_RELEASE_TABLET | Freq: Every day | ORAL | Status: DC
  Administered 2018-06-11 – 2018-06-12 (×2): 40 mg via ORAL
  Filled 2018-06-10 (×2): qty 1

## 2018-06-10 NOTE — H&P (Signed)
History and Physical    [name] Rojas WUJ:[id number] DOB: 1941/08/07 DOA: 06/10/2018  PCP: Benita StabileHall, John Z, MD   Patient coming from: Home  Chief Complaint: Headache/dizziness  HPI: [name] Rojas is a 77 y.o. female with medical history significant for chronic low back pain, fibromyalgia, arthritis, hypertension, prior CVA 15 years ago, hypertension, and GERD who presented to the emergency department after waking up earlier this morning at approximately 4 AM with a dull, achy, frontal headache with holocranial pressure.  This was accompanied with some dizziness and sensation of the room shifting slightly.  She denies any weakness or numbness to her extremities and has had no difficulty with her speech but claims to be very unsteady on her feet.  She did have some higher sodium foods yesterday and checked her blood pressure at home and this was noted to be quite elevated.  She called EMS and was brought to ED for further evaluation.  She does have some very mild left-sided deficits from her prior CVA and was supposed to take aspirin 81 mg daily but has not been doing so.  Her headache and dizziness are currently improved. Patient denies any nausea, vomiting, visual deficits, abdominal pain, or chest pain. She is compliant with her home blood pressure medications.   ED Course: Vital signs are stable with blood pressure elevation noted, last at 168/102.  She is otherwise pleasant and in no acute distress currently.  2 view chest x-ray with no acute findings, CT head with mild chronic ischemic white matter changes and no acute findings.  EKG was sinus rhythm at 71 bpm.  She has been given 1 dose of meclizine and ambulated with no improvement.  Review of Systems: All others reviewed and otherwise negative.  Past Medical History:  Diagnosis Date  . Anemia   . Anxiety   . Arthritis   . Chronic back pain   . Depression   . Fibromyalgia   . GERD (gastroesophageal reflux disease)   . Hypertension   .  Osteoporosis   . Stroke [facility](HCC)     Past Surgical History:  Procedure Laterality Date  . ABDOMINAL HYSTERECTOMY     bleeding  . BACK SURGERY    . GASTRIC BYPASS    . KNEE SURGERY    . TONSILLECTOMY       reports that she has never smoked. She has never used smokeless tobacco. She reports that she does not drink alcohol or use drugs.  Allergies  Allergen Reactions  . Codeine Nausea And Vomiting  . Iodinated Diagnostic Agents Itching and Nausea Only  . Lyrica [Pregabalin]     swelling  . Statins Nausea Only    Muscle weakness Muscle weakness     Family History  Problem Relation Age of Onset  . Arthritis Mother   . Asthma Mother   . Heart disease Mother   . Early death Father        trauma  . Heart disease Sister   . Stroke Sister     Prior to Admission medications   Medication Sig Start Date End Date Taking? Authorizing Provider  ascorbic acid (VITAMIN C) 500 MG tablet Take 1 tablet by mouth daily.   Yes [provider]  aspirin EC 81 MG tablet Take 1 tablet (81 mg total) by mouth daily. Patient taking differently: Take 81 mg by mouth as needed.  08/03/17  Yes Eustace MooreNelson, Yvonne Sue, MD  cholecalciferol (VITAMIN D) 1000 units tablet Take 1,000 Units by mouth daily.  Yes [provider]  Cyanocobalamin (B-12 PO) Take 1 tablet by mouth daily.   Yes [provider]  escitalopram (LEXAPRO) 20 MG tablet Take 1 tablet (20 mg total) by mouth daily. 04/30/18  Yes Hisada, Barbee Cough, MD  hydrochlorothiazide (HYDRODIURIL) 25 MG tablet Take 1 tablet (25 mg total) by mouth daily. 08/03/17  Yes Eustace Moore, MD  HYDROcodone-acetaminophen [facility]) 10-325 MG tablet Take 1 tablet by mouth every 6 (six) hours as needed for moderate pain.  11/13/17  Yes [provider]  loratadine (CLARITIN) 10 MG tablet Take 10 mg by mouth daily.   Yes [provider]  naproxen (NAPROSYN) 500 MG tablet Take 1 tablet (500 mg total) by mouth 2 (two) times  daily. Patient taking differently: Take 500 mg by mouth daily as needed for moderate pain.  03/06/18  Yes Bethel Born, PA-C  omeprazole (PRILOSEC) 40 MG capsule TAKE 1 CAPSULE BY MOUTH EVERY DAY. Patient taking differently: TAKE 1 CAPSULE BY MOUTH EVERY DAY AS NEEDED FOR ACID REFLUX 10/31/17  Yes Eustace Moore, MD  potassium chloride (KLOR-CON M10) 10 MEQ tablet Take 1 tablet (10 mEq total) by mouth daily. 08/03/17  Yes Eustace Moore, MD  tiZANidine (ZANAFLEX) 4 MG tablet Take 1 tablet (4 mg total) by mouth at bedtime. 06/27/17  Yes Eustace Moore, MD    Physical Exam: Vitals:   06/10/18 1815 06/10/18 1845 06/10/18 1900 06/10/18 2000  BP:      Pulse: 64  73   Resp:  20 18 20   Temp:      TempSrc:      SpO2: 100%  98%   Weight:      Height:        Constitutional: NAD, calm, comfortable Vitals:   06/10/18 1815 06/10/18 1845 06/10/18 1900 06/10/18 2000  BP:      Pulse: 64  73   Resp:  20 18 20   Temp:      TempSrc:      SpO2: 100%  98%   Weight:      Height:       Eyes: lids and conjunctivae normal ENMT: Mucous membranes are moist.  Neck: normal, supple Respiratory: clear to auscultation bilaterally. Normal respiratory effort. No accessory muscle use.  Cardiovascular: Regular rate and rhythm, no murmurs. No extremity edema. Abdomen: no tenderness, no distention. Bowel sounds positive.  Musculoskeletal:  No joint deformity upper and lower extremities.   Neurological: Symmetric motor strength to all 4 extremities and sensation intact.  Cranial nerves II through XII grossly intact on my exam. Skin: no rashes, lesions, ulcers.  Psychiatric: Normal judgment and insight. Alert and oriented x 3. Normal mood.   Labs on Admission: I have personally reviewed following labs and imaging studies  CBC: Recent Labs  Lab 06/10/18 1941  WBC 4.8  NEUTROABS 2.5  HGB 11.3*  HCT 34.8*  MCV 103.9*  PLT 201   Basic Metabolic Panel: Recent Labs  Lab 06/10/18 1941  NA  139  K 3.4*  CL 104  CO2 27  GLUCOSE 96  BUN 19  CREATININE 0.63  CALCIUM 9.4   GFR: Estimated Creatinine Clearance: 60.2 mL/min (by C-G formula based on SCr of 0.63 mg/dL). Liver Function Tests: Recent Labs  Lab 06/10/18 1941  AST 22  ALT 23  ALKPHOS 41  BILITOT 0.8  PROT 6.4*  ALBUMIN 4.0   No results for input(s): LIPASE, AMYLASE in the last 168 hours. No results for input(s): AMMONIA in the  last 168 hours. Coagulation Profile: Recent Labs  Lab 06/10/18 1941  INR 1.12   Cardiac Enzymes: No results for input(s): CKTOTAL, CKMB, CKMBINDEX, TROPONINI in the last 168 hours. BNP (last 3 results) No results for input(s): PROBNP in the last 8760 hours. HbA1C: No results for input(s): HGBA1C in the last 72 hours. CBG: No results for input(s): GLUCAP in the last 168 hours. Lipid Profile: No results for input(s): CHOL, HDL, LDLCALC, TRIG, CHOLHDL, LDLDIRECT in the last 72 hours. Thyroid Function Tests: No results for input(s): TSH, T4TOTAL, FREET4, T3FREE, THYROIDAB in the last 72 hours. Anemia Panel: No results for input(s): VITAMINB12, FOLATE, FERRITIN, TIBC, IRON, RETICCTPCT in the last 72 hours. Urine analysis:    Component Value Date/Time   COLORURINE STRAW (A) 07/06/2017 0037   APPEARANCEUR CLEAR 07/06/2017 0037   LABSPEC 1.009 07/06/2017 0037   PHURINE 6.0 07/06/2017 0037   GLUCOSEU NEGATIVE 07/06/2017 0037   HGBUR NEGATIVE 07/06/2017 0037   BILIRUBINUR neg 11/13/2017 1056   KETONESUR NEGATIVE 07/06/2017 0037   PROTEINUR neg 11/13/2017 1056   PROTEINUR NEGATIVE 07/06/2017 0037   UROBILINOGEN 0.2 11/13/2017 1056   NITRITE neg 11/13/2017 1056   NITRITE NEGATIVE 07/06/2017 0037   LEUKOCYTESUR Negative 11/13/2017 1056    Radiological Exams on Admission: Dg Chest 2 View  Result Date: 06/10/2018 CLINICAL DATA:  Dizziness and headache.  Shortness of breath. EXAM: CHEST - 2 VIEW COMPARISON:  Chest x-ray dated February 01, 2017. FINDINGS: The heart size and  mediastinal contours are within normal limits. Normal pulmonary vascularity. Atherosclerotic calcification of the aortic arch. No focal consolidation, pleural effusion, or pneumothorax. No acute osseous abnormality. IMPRESSION: 1.  No active cardiopulmonary disease. 2.  Aortic atherosclerosis (ICD10-I70.0). Electronically Signed   By: Obie Dredge M.D.   On: 06/10/2018 19:07   Ct Head Wo Contrast  Result Date: 06/10/2018 CLINICAL DATA:  Headache. EXAM: CT HEAD WITHOUT CONTRAST TECHNIQUE: Contiguous axial images were obtained from the base of the skull through the vertex without intravenous contrast. COMPARISON:  None. FINDINGS: Brain: Mild chronic ischemic white matter disease is noted. No mass effect or midline shift is noted. Ventricular size is within normal limits. There is no evidence of mass lesion, hemorrhage or acute infarction. Vascular: No hyperdense vessel or unexpected calcification. Skull: Normal. Negative for fracture or focal lesion. Sinuses/Orbits: No acute finding. Other: None. IMPRESSION: Mild chronic ischemic white matter disease. No acute intracranial abnormality seen. Electronically Signed   By: Lupita Raider, M.D.   On: 06/10/2018 19:02    EKG: Independently reviewed. SR 71 bpm.  Assessment/Plan Principal Problem:   Dizziness Active Problems:   History of stroke   GERD (gastroesophageal reflux disease)   Fibromyalgia   Essential hypertension   Chronic pain   Osteoporosis    1. Headache with vertigo-suspect BPPV vs cerebellar CVA.  Further evaluation with brain MRI in a.m. to assess for possible CVA.  PT evaluation with fall precautions.  Vestibular rehabilitation; hold meclizine for now to have proper evaluation. 2. History of prior CVA with minimal left-sided residual deficits.  Continue on aspirin 325 mg daily for now. Atorvastatin 80mg  daily. Check lipid panel as well as A1c. Check 2D echo and carotid U/S. Hydralazine for blood pressure control and allow up to 180  systolic and 100 diastolic. 3. Hypertension.  Allow for some blood pressure elevation at this time on account of suspected CVA.  Hold HCTZ.  Hydralazine pushes as noted above. 4. Chronic pain/fibromyalgia.  Continue home medications. 5. GERD.  Continue PPI. 6. Depression/anxiety.  Continue Lexapro.   DVT prophylaxis: Lovenox Code Status: Full Family Communication: None at bedside Disposition Plan:Evaluation for CVA/dizziness Consults called:None Admission status: Obs, tele   Gannon Heinzman Hoover Brunette DO [facility] Pager [phone]  If 7PM-7AM, please contact night-coverage [email or web address] Password [facility]  06/10/2018, 9:31 PM

## 2018-06-10 NOTE — ED Provider Notes (Signed)
Emergency Department Provider Note   I have reviewed the triage vital signs and the nursing notes.   HISTORY  Chief Complaint Dizziness and Headache   HPI [name] Rojas is a 77 y.o. female with PMH of arthritis, HTN, prior CVA, and Fibromyalgia comes to the emergency department for evaluation of lightheadedness/dizziness since waking up this morning with associated headache.  She states her headache has resolved but as soon as she woke up she was feeling both lightheaded and a sensation that the room was shifting slightly.  No significant worsening or improvement with movement.  No ear pain or ringing in the ears.  She denies similar symptoms in the past.  She initially blamed it on eating salty foods yesterday but has been noticing her blood pressure being elevated throughout the day so called EMS.  She does have history of prior stroke with left-sided deficits at that time but no weakness or numbness at this time.  She describes her walking as "staggering" and states she does not normally require assistance to walk but that today she does. No radiation of symptoms. Her HA has resolved.    Past Medical History:  Diagnosis Date  . Anemia   . Anxiety   . Arthritis   . Chronic back pain   . Depression   . Fibromyalgia   . GERD (gastroesophageal reflux disease)   . Hypertension   . Osteoporosis   . Stroke [facility])     Patient Active Problem List   Diagnosis Date Noted  . Dizziness 06/10/2018  . Major depressive disorder, recurrent episode, mild with anxious distress (HCC) 09/25/2017  . Mild neurocognitive disorder 07/12/2017  . Vitamin D deficiency 06/27/2017  . Osteoporosis 06/27/2017  . Fibromyalgia 03/30/2017  . History of medication noncompliance 03/30/2017  . Osteoporosis of lower leg associated with endocrine disorder 03/30/2017  . Primary osteoarthritis of left shoulder 04/11/2016  . Hearing difficulty 09/17/2015  . Neuropathic pain 04/10/2013  . Anxiety disorder  04/10/2013  . Failed back syndrome 08/24/2012  . Pulmonary hypertension (HCC) 04/25/2012  . Congenital heart disease with intracardiac shunting 04/25/2012  . History of stroke 05/23/2011  . Status post total knee replacement 02/03/2010  . GERD (gastroesophageal reflux disease) 02/03/2010  . Chronic pain 02/02/2010  . Essential hypertension 01/15/2010  . Status post bariatric surgery 04/30/2009    Past Surgical History:  Procedure Laterality Date  . ABDOMINAL HYSTERECTOMY     bleeding  . BACK SURGERY    . GASTRIC BYPASS    . KNEE SURGERY    . TONSILLECTOMY      Allergies Codeine; Iodinated diagnostic agents; Lyrica [pregabalin]; and Statins  Family History  Problem Relation Age of Onset  . Arthritis Mother   . Asthma Mother   . Heart disease Mother   . Early death Father        trauma  . Heart disease Sister   . Stroke Sister     Social History Social History   Tobacco Use  . Smoking status: Never Smoker  . Smokeless tobacco: Never Used  Substance Use Topics  . Alcohol use: No  . Drug use: No    Review of Systems  Constitutional: No fever/chills. Positive lightheadedness with mild vertigo.  Eyes: No visual changes. ENT: No sore throat. Cardiovascular: Denies chest pain. Respiratory: Denies shortness of breath. Gastrointestinal: No abdominal pain. No nausea, no vomiting. No diarrhea. No constipation. Genitourinary: Negative for dysuria. Musculoskeletal: Negative for back pain. Skin: Negative for rash. Neurological: Negative for focal  weakness or numbness. Positive HA (resolved).   10-point ROS otherwise negative.  ____________________________________________   PHYSICAL EXAM:  VITAL SIGNS: ED Triage Vitals  Enc Vitals Group     BP 06/10/18 1812 (!) 168/102     Pulse Rate 06/10/18 1800 75     Resp 06/10/18 1812 20     Temp 06/10/18 1812 98.2 F (36.8 C)     Temp Source 06/10/18 1812 Oral     SpO2 06/10/18 1800 100 %     Weight 06/10/18 1813 170  lb (77.1 kg)     Height 06/10/18 1813 5\' 4"  (1.626 m)     Pain Score 06/10/18 1813 0   Constitutional: Alert and oriented. Well appearing and in no acute distress. Eyes: Conjunctivae are normal. PERRL. EOMI. Head: Atraumatic. Nose: No congestion/rhinnorhea. Mouth/Throat: Mucous membranes are moist.  Neck: No stridor.   Cardiovascular: Normal rate, regular rhythm. Good peripheral circulation. Grossly normal heart sounds.   Respiratory: Normal respiratory effort.  No retractions. Lungs CTAB. Gastrointestinal: Soft and nontender. No distention.  Musculoskeletal: No lower extremity tenderness nor edema. No gross deformities of extremities. Neurologic:  Normal speech and language. No gross focal neurologic deficits are appreciated. Normal CN exam 2-12. No pronator drift. Normal finger-to-nose testing. Normal heel-to-shin.  Skin:  Skin is warm, dry and intact. No rash noted.  ____________________________________________   LABS (all labs ordered are listed, but only abnormal results are displayed)  Labs Reviewed  CBC WITH DIFFERENTIAL/PLATELET - Abnormal; Notable for the following components:      Result Value   RBC 3.35 (*)    Hemoglobin 11.3 (*)    HCT 34.8 (*)    MCV 103.9 (*)    All other components within normal limits  COMPREHENSIVE METABOLIC PANEL - Abnormal; Notable for the following components:   Potassium 3.4 (*)    Total Protein 6.4 (*)    All other components within normal limits  PROTIME-INR  APTT  ETHANOL  RAPID URINE DRUG SCREEN, HOSP PERFORMED  URINALYSIS, ROUTINE W REFLEX MICROSCOPIC  I-STAT TROPONIN, ED   ____________________________________________  EKG   EKG Interpretation  Date/Time:  Sunday June 10 2018 18:45:20 EDT Ventricular Rate:  71 PR Interval:    QRS Duration: 85 QT Interval:  382 QTC Calculation: 416 R Axis:   20 Text Interpretation:  Sinus rhythm Ventricular premature complex Consider right atrial enlargement No STEMI.  Confirmed by Alona Bene [phone]) on 06/10/2018 7:02:35 PM       ____________________________________________  RADIOLOGY  Dg Chest 2 View  Result Date: 06/10/2018 CLINICAL DATA:  Dizziness and headache.  Shortness of breath. EXAM: CHEST - 2 VIEW COMPARISON:  Chest x-ray dated February 01, 2017. FINDINGS: The heart size and mediastinal contours are within normal limits. Normal pulmonary vascularity. Atherosclerotic calcification of the aortic arch. No focal consolidation, pleural effusion, or pneumothorax. No acute osseous abnormality. IMPRESSION: 1.  No active cardiopulmonary disease. 2.  Aortic atherosclerosis (ICD10-I70.0). Electronically Signed   By: Obie Dredge M.D.   On: 06/10/2018 19:07   Ct Head Wo Contrast  Result Date: 06/10/2018 CLINICAL DATA:  Headache. EXAM: CT HEAD WITHOUT CONTRAST TECHNIQUE: Contiguous axial images were obtained from the base of the skull through the vertex without intravenous contrast. COMPARISON:  None. FINDINGS: Brain: Mild chronic ischemic white matter disease is noted. No mass effect or midline shift is noted. Ventricular size is within normal limits. There is no evidence of mass lesion, hemorrhage or acute infarction. Vascular: No hyperdense vessel or unexpected calcification.  Skull: Normal. Negative for fracture or focal lesion. Sinuses/Orbits: No acute finding. Other: None. IMPRESSION: Mild chronic ischemic white matter disease. No acute intracranial abnormality seen. Electronically Signed   By: Lupita RaiderJames  Green Jr, M.D.   On: 06/10/2018 19:02    ____________________________________________   PROCEDURES  Procedure(s) performed:   Procedures  None ____________________________________________   INITIAL IMPRESSION / ASSESSMENT AND PLAN / ED COURSE  Pertinent labs & imaging results that were available during my care of the patient were reviewed by me and considered in my medical decision making (see chart for details).  Patient presents to the emergency department  for evaluation of dizziness with slight vertigo upon waking this morning.  She has gait abnormality here in the emergency department which is broad-based and requiring assistance to keep her steady.  She has no other focal neurological deficits but does describe some subjective vertigo symptoms.  This, in the setting of elevated blood pressures at home have my suspicion for stroke elevated.  Patient denies any chest pain but states yesterday she was feeling some dyspnea but that has resolved.  Plan for chest x-ray along with labs, CT head, and will reassess.   Plain films, labs, CT reviewed with no acute findings.  Patient continues to feel slight vertigo sensation and has gait instability with walking.  Blood pressures have improved without intervention.  I continue to have some concern for possible stroke.  She is outside of any kind of intervention window and I have no concern for large vessel occlusion.  Plan for admission for stroke work-up and MRI tomorrow as this is not available to me at this time.   Discussed patient's case with Hospitalist, Dr. Sherryll BurgerShah to request admission. Patient and family (if present) updated with plan. Care transferred to Hospitalist service.  I reviewed all nursing notes, vitals, pertinent old records, EKGs, labs, imaging (as available).  ____________________________________________  FINAL CLINICAL IMPRESSION(S) / ED DIAGNOSES  Final diagnoses:  Gait instability  Vertigo    MEDICATIONS GIVEN DURING THIS VISIT:  Medications  meclizine (ANTIVERT) tablet 12.5 mg (12.5 mg Oral Given 06/10/18 1958)    Note:  This document was prepared using Dragon voice recognition software and may include unintentional dictation errors.  Alona BeneJoshua Long, MD Emergency Medicine    Long, Arlyss RepressJoshua G, MD 06/10/18 [id number]

## 2018-06-10 NOTE — ED Notes (Signed)
Patient transported to X-ray 

## 2018-06-10 NOTE — ED Triage Notes (Signed)
Pt reports woke up with dizziness and headache.  Has been checking her bp throughout the day and noticed it was high.  EMS got bp of 190/100.  EMS started 20g iv in left ac.  Pt alert and oriented.

## 2018-06-11 ENCOUNTER — Observation Stay (HOSPITAL_BASED_OUTPATIENT_CLINIC_OR_DEPARTMENT_OTHER): Payer: Medicare Other

## 2018-06-11 ENCOUNTER — Other Ambulatory Visit: Payer: Self-pay

## 2018-06-11 ENCOUNTER — Observation Stay (HOSPITAL_BASED_OUTPATIENT_CLINIC_OR_DEPARTMENT_OTHER)
Admit: 2018-06-11 | Discharge: 2018-06-11 | Disposition: A | Discharge status: Home / Self Care | Payer: Medicare Other | Attending: Internal Medicine | Admitting: Internal Medicine

## 2018-06-11 ENCOUNTER — Observation Stay (HOSPITAL_COMMUNITY): Payer: Medicare Other

## 2018-06-11 DIAGNOSIS — R42 Dizziness and giddiness: Secondary | ICD-10-CM | POA: Diagnosis not present

## 2018-06-11 DIAGNOSIS — R2681 Unsteadiness on feet: Secondary | ICD-10-CM | POA: Diagnosis not present

## 2018-06-11 DIAGNOSIS — I340 Nonrheumatic mitral (valve) insufficiency: Secondary | ICD-10-CM | POA: Diagnosis not present

## 2018-06-11 DIAGNOSIS — I69952 Hemiplegia and hemiparesis following unspecified cerebrovascular disease affecting left dominant side: Secondary | ICD-10-CM | POA: Diagnosis not present

## 2018-06-11 DIAGNOSIS — I6523 Occlusion and stenosis of bilateral carotid arteries: Secondary | ICD-10-CM | POA: Diagnosis not present

## 2018-06-11 DIAGNOSIS — G8191 Hemiplegia, unspecified affecting right dominant side: Secondary | ICD-10-CM | POA: Diagnosis not present

## 2018-06-11 DIAGNOSIS — R51 Headache: Secondary | ICD-10-CM | POA: Diagnosis not present

## 2018-06-11 DIAGNOSIS — G4452 New daily persistent headache (NDPH): Secondary | ICD-10-CM | POA: Diagnosis not present

## 2018-06-11 DIAGNOSIS — M797 Fibromyalgia: Secondary | ICD-10-CM | POA: Diagnosis not present

## 2018-06-11 DIAGNOSIS — I10 Essential (primary) hypertension: Secondary | ICD-10-CM | POA: Diagnosis not present

## 2018-06-11 LAB — TROPONIN I
Troponin I: 0.03 ng/mL (ref <0.03)
Troponin I: 0.03 ng/mL (ref <0.03)
Troponin I: 0.03 ng/mL (ref <0.03)

## 2018-06-11 LAB — LIPID PANEL
CHOL/HDL RATIO: 3 ratio
CHOLESTEROL: 136 mg/dL (ref 0–200)
HDL: 46 mg/dL (ref >40)
LDL Cholesterol: 80 mg/dL (ref 0–99)
TRIGLYCERIDES: 49 mg/dL (ref <150)
VLDL: 10 mg/dL (ref 0–40)

## 2018-06-11 LAB — HEMOGLOBIN A1C
Hgb A1c MFr Bld: 5.3 % (ref 4.8–5.6)
Mean Plasma Glucose: 105.41 mg/dL

## 2018-06-11 LAB — BASIC METABOLIC PANEL
Anion gap: 6 (ref 5–15)
BUN: 17 mg/dL (ref 6–20)
CHLORIDE: 106 mmol/L (ref 101–111)
CO2: 31 mmol/L (ref 22–32)
CREATININE: 0.69 mg/dL (ref 0.44–1.00)
Calcium: 9.3 mg/dL (ref 8.9–10.3)
GFR calc Af Amer: >60 mL/min (ref >60)
GFR calc non Af Amer: >60 mL/min (ref >60)
Glucose, Bld: 108 mg/dL — ABNORMAL HIGH (ref 65–99)
Potassium: 4.3 mmol/L (ref 3.5–5.1)
Sodium: 143 mmol/L (ref 135–145)

## 2018-06-11 LAB — CBC
HCT: 34.2 % — ABNORMAL LOW (ref 36.0–46.0)
Hemoglobin: 10.9 g/dL — ABNORMAL LOW (ref 12.0–15.0)
MCH: 33.5 pg (ref 26.0–34.0)
MCHC: 31.9 g/dL (ref 30.0–36.0)
MCV: 105.2 fL — AB (ref 78.0–100.0)
PLATELETS: 185 10*3/uL (ref 150–400)
RBC: 3.25 MIL/uL — ABNORMAL LOW (ref 3.87–5.11)
RDW: 13.2 % (ref 11.5–15.5)
WBC: 3.6 10*3/uL — ABNORMAL LOW (ref 4.0–10.5)

## 2018-06-11 LAB — ECHOCARDIOGRAM COMPLETE
Height: 64 in
Weight: 2720 oz

## 2018-06-11 LAB — TSH: TSH: 0.532 u[IU]/mL (ref 0.350–4.500)

## 2018-06-11 MED ORDER — VITAMIN B-12 100 MCG PO TABS
100.0000 ug | ORAL_TABLET | Freq: Every day | ORAL | Status: DC
  Administered 2018-06-11 – 2018-06-12 (×2): 100 ug via ORAL
  Filled 2018-06-11 (×5): qty 1

## 2018-06-11 NOTE — Procedures (Addendum)
ELECTROENCEPHALOGRAM REPORT  Date of Study: 06/11/2018  Patient's Name: [name] Rojas MRN: [id number] Date of Birth: [birth year]  Referring Provider: Dr. Onalee Huaavid Tat  Clinical History: This is a 77 year old woman with dizziness  Medications: acetaminophen (TYLENOL) tablet 650 mg  aspirin EC tablet 325 mg  atorvastatin (LIPITOR) tablet 80 mg  cholecalciferol (VITAMIN D) tablet 1,000 Units  enoxaparin (LOVENOX) injection 40 mg  escitalopram (LEXAPRO) tablet 20 mg  hydrALAZINE (APRESOLINE) injection 10 mg  HYDROcodone-acetaminophen (NORCO) 10-325 MG per tablet 1 tablet  loratadine (CLARITIN) tablet 10 mg  pantoprazole (PROTONIX) EC tablet 40 mg  potassium chloride SA (K-DUR,KLOR-CON) CR tablet 10 mEq  vitamin B-12 (CYANOCOBALAMIN) tablet 100 mcg  vitamin C (ASCORBIC ACID) tablet 500 mg   Technical Summary: A multichannel digital EEG recording measured by the international 10-20 system with electrodes applied with paste and impedances below 5000 ohms performed in our laboratory with EKG monitoring in an awake and asleep patient.  Hyperventilation and photic stimulation were not performed.  The digital EEG was referentially recorded, reformatted, and digitally filtered in a variety of bipolar and referential montages for optimal display.    Description: The patient is awake and asleep during the recording.  During maximal wakefulness, there is a symmetric, medium voltage 8.5 Hz posterior dominant rhythm that attenuates with eye opening.  The record is symmetric.  During drowsiness and stage I sleep, there is an increase in theta slowing of the background with occasional vertex waves seen.  Hyperventilation and photic stimulation were not performed. There is electrode artifact occasionally seen at T3. There were no epileptiform discharges or electrographic seizures seen.    EKG lead was unremarkable.  Impression: This awake and asleep EEG is normal.    Clinical Correlation: A normal EEG  does not exclude a clinical diagnosis of epilepsy.  Clinical correlation is advised.   Patrcia DollyKaren Ondine Gemme, M.D.

## 2018-06-11 NOTE — ED Notes (Signed)
Date and time results received: 06/11/18 0922 (use smartphrase ".now" to insert current time)  Test: trop Critical Value: 0.03  Name of Provider Notified: dr tat via text  Orders Received? Or Actions Taken?: none at this time

## 2018-06-11 NOTE — Care Management Note (Signed)
Case Management Note  Patient Details  Name: [name] Rojas MRN: [id number] Date of Birth: 1941-08-22  Subjective/Objective:  Dizziness, stroke work up in progress. Patient from home alone, ind with ADL's. Has cane, has not been using it. [place] in [place]. Has PCP, Dr. Catalina PizzaZach Hall. [facility]ses [facility] or [facility]. [facility] Offered choice of Home health agencies.                Action/Plan: Olegario MessierKathy of [facility] notified and will obtain orders when available via Epic.   Expected Discharge Date:    06/12/2018              Expected Discharge Plan:  Home w Home Health Services  In-House Referral:     Discharge planning Services  CM Consult  Post Acute Care Choice:  Home Health Choice offered to:  Patient  DME Arranged:    DME Agency:     HH Arranged:  PT HH Agency:  [facility]  Status of Service:  Completed, signed off  If discussed at Long Length of Stay Meetings, dates discussed:    Additional Comments:  Georga Stys, Chrystine OilerSharley Diane, RN 06/11/2018, 11:54 AM

## 2018-06-11 NOTE — Progress Notes (Signed)
EEG completed; results pending.    

## 2018-06-11 NOTE — Care Management Obs Status (Signed)
MEDICARE OBSERVATION STATUS NOTIFICATION   Patient Details  Name: [name] Rojas MRN: [id number] Date of Birth: January 02, 1941   Medicare Observation Status Notification Given:  Yes    Win Guajardo, Chrystine OilerSharley Diane, RN 06/11/2018, 11:52 AM

## 2018-06-11 NOTE — Evaluation (Signed)
Speech Language Pathology Evaluation Patient Details Name: [name] Rojas MRN: [id number] DOB: Jul 20, 1941 Today's Date: 06/11/2018 Time: 4098-1191 SLP Time Calculation (min) (ACUTE ONLY): 20 min  Problem List:  Patient Active Problem List   Diagnosis Date Noted  . Gait instability   . Vertigo   . Dizziness 06/10/2018  . CVA (cerebral vascular accident) (HCC) 06/10/2018  . Major depressive disorder, recurrent episode, mild with anxious distress (HCC) 09/25/2017  . Mild neurocognitive disorder 07/12/2017  . Vitamin D deficiency 06/27/2017  . Osteoporosis 06/27/2017  . Fibromyalgia 03/30/2017  . History of medication noncompliance 03/30/2017  . Osteoporosis of lower leg associated with endocrine disorder 03/30/2017  . Primary osteoarthritis of left shoulder 04/11/2016  . Hearing difficulty 09/17/2015  . Neuropathic pain 04/10/2013  . Anxiety disorder 04/10/2013  . Failed back syndrome 08/24/2012  . Pulmonary hypertension (HCC) 04/25/2012  . Congenital heart disease with intracardiac shunting 04/25/2012  . History of stroke 05/23/2011  . Status post total knee replacement 02/03/2010  . GERD (gastroesophageal reflux disease) 02/03/2010  . Chronic pain 02/02/2010  . Essential hypertension 01/15/2010  . Status post bariatric surgery 04/30/2009   Past Medical History:  Past Medical History:  Diagnosis Date  . Anemia   . Anxiety   . Arthritis   . Chronic back pain   . Depression   . Fibromyalgia   . GERD (gastroesophageal reflux disease)   . Hypertension   . Osteoporosis   . Stroke Procedure [facility])    Past Surgical History:  Past Surgical History:  Procedure Laterality Date  . ABDOMINAL HYSTERECTOMY     bleeding  . BACK SURGERY    . GASTRIC BYPASS    . KNEE SURGERY    . TONSILLECTOMY     HPI:  77 year old female with history of fibromyalgia, chronic back pain, hypertension, stroke with residual left hemiparesis, GERD presenting with right frontal headache, dizziness, and gait  instability that began when she woke up in the morning of 06/10/2018.  The patient had been also complaining of some subjective chills.  She has some chest discomfort on the evening of 06/10/2018 with some associated shortness of breath.  She states that it was a dull sensation lasting only a few minutes.  There is no exacerbating or alleviating factors.  The patient felt that moving and getting from a supine position made her dizziness worse.  As result, the patient presented emergency department for further evaluation.  Patient denied any nausea, vomiting, diarrhea, abdominal pain, dysuria, hematuria.  The patient denies any falling or syncope.  Apparently, the patient checked her blood pressure at home and it was noted to be elevated.   Assessment / Plan / Recommendation Clinical Impression  Pt alert, oriented, and able to verbally express complex wants and needs, however Pt reports recent difficulties with working and prospective memory in addition to occasional difficulty articulating words. Pt lives alone and was independent with home management prior to admission. She already implements some memory strategies to assist with medication management, however she may benefit from short term SLP services in the home to assess her in her environment and assist with developing additional strategies. Will recommend [facility] SLP services. All further needs can be addressed at next venue. SLP will sign off.     SLP Assessment  SLP Recommendation/Assessment: All further Speech Lanaguage Pathology  needs can be addressed in the next venue of care SLP Visit Diagnosis: Cognitive communication deficit (R41.841)    Follow Up Recommendations  Home health SLP    Frequency  and Duration           SLP Evaluation Cognition  Overall Cognitive Status: Impaired/Different from baseline Arousal/Alertness: Awake/alert Orientation Level: Oriented X4 Memory: Impaired Memory Impairment: Prospective memory;Decreased recall of  new information Awareness: Appears intact Problem Solving: Appears intact Safety/Judgment: Appears intact       Comprehension  Auditory Comprehension Overall Auditory Comprehension: Appears within functional limits for tasks assessed Yes/No Questions: Within Functional Limits Commands: Within Functional Limits Conversation: Complex Interfering Components: Working Theatre managermemory Visual Recognition/Discrimination Discrimination: Not tested Reading Comprehension Reading Status: Not tested    Expression Expression Primary Mode of Expression: Verbal Verbal Expression Overall Verbal Expression: Appears within functional limits for tasks assessed Initiation: No impairment Automatic Speech: Name;Social Response Level of Generative/Spontaneous Verbalization: Conversation Repetition: No impairment Naming: No impairment Pragmatics: No impairment Non-Verbal Means of Communication: Not applicable Written Expression Written Expression: Not tested   Oral / Motor  Oral Motor/Sensory Function Overall Oral Motor/Sensory Function: Within functional limits Motor Speech Overall Motor Speech: Appears within functional limits for tasks assessed Respiration: Within functional limits Phonation: Normal Resonance: Within functional limits Articulation: Within functional limitis Intelligibility: Intelligible Motor Planning: Witnin functional limits Motor Speech Errors: Not applicable   Thank you,  Havery MorosDabney Porter, CCC-SLP [phone]                     PORTER,DABNEY 06/11/2018, 12:40 PM

## 2018-06-11 NOTE — Progress Notes (Signed)
*  PRELIMINARY RESULTS* Echocardiogram 2D Echocardiogram has been performed.  Stacey DrainWhite, Arkel Cartwright J 06/11/2018, 2:44 PM

## 2018-06-11 NOTE — Evaluation (Signed)
Physical Therapy Evaluation Patient Details Name: [name] Rojas MRN: [id number] DOB: 1941/03/30 Today's Date: 06/11/2018   History of Present Illness  [name] Rojas is a 77 y.o. female with medical history significant for chronic low back pain, fibromyalgia, arthritis, hypertension, prior CVA 15 years ago, hypertension, and GERD who presented to the emergency department after waking up earlier this morning at approximately 4 AM with a dull, achy, frontal headache with holocranial pressure.  This was accompanied with some dizziness and sensation of the room shifting slightly.  She denies any weakness or numbness to her extremities and has had no difficulty with her speech but claims to be very unsteady on her feet.  She did have some higher sodium foods yesterday and checked her blood pressure at home and this was noted to be quite elevated.  She called EMS and was brought to ED for further evaluation.  She does have some very mild left-sided deficits from her prior CVA and was supposed to take aspirin 81 mg daily but has not been doing so.  Her headache and dizziness are currently improved.    Clinical Impression  Patient functioning near baseline for functional mobility and gait, other having to lean on walls and nearby objects for support when not using quad cane, no loss of balance during gait training and tolerated sitting up in chair after therapy.  Patient will benefit from continued physical therapy in hospital and recommended venue below to increase strength, balance, endurance for safe ADLs and gait.    Follow Up Recommendations Home health PT    Equipment Recommendations  None recommended by PT    Recommendations for Other Services       Precautions / Restrictions Precautions Precautions: Fall Restrictions Weight Bearing Restrictions: No      Mobility  Bed Mobility Overal bed mobility: Modified Independent                Transfers Overall transfer level: Needs assistance    Transfers: Sit to/from Stand;Stand Pivot Transfers Sit to Stand: Supervision Stand pivot transfers: Supervision          Ambulation/Gait Ambulation/Gait assistance: Supervision Gait Distance (Feet): 65 Feet Assistive device: Quad cane Gait Pattern/deviations: Decreased step length - right;Decreased step length - left;Decreased stride length Gait velocity: slow   General Gait Details: demonstrates slow slightly labored cadence with tendency to lean on nearby objects walls for support, requiring use of quad cane to ambulate in hallway without loss of balance  Stairs            Wheelchair Mobility    Modified Rankin (Stroke Patients Only)       Balance Overall balance assessment: Needs assistance Sitting-balance support: Feet supported;No upper extremity supported Sitting balance-Leahy Scale: Good     Standing balance support: Single extremity supported;During functional activity Standing balance-Leahy Scale: Fair                               Pertinent Vitals/Pain Pain Assessment: 0-10 Pain Score: 10-Worst pain ever Pain Location: headache on top of head and low back pain Pain Descriptors / Indicators: Aching Pain Intervention(s): Limited activity within patient's tolerance;Monitored during session    Home Living Family/patient expects to be discharged to:: Private residence Living Arrangements: Alone Available Help at Discharge: Family Type of Home: Apartment Home Access: Level entry     Home Layout: One level Home Equipment: Cane - single point;Toilet riser      Prior  Function Level of Independence: Independent with assistive device(s)         Comments: community ambulator with SPC PRN     Hand Dominance        Extremity/Trunk Assessment   Upper Extremity Assessment Upper Extremity Assessment: Generalized weakness    Lower Extremity Assessment Lower Extremity Assessment: Generalized weakness    Cervical / Trunk  Assessment Cervical / Trunk Assessment: Normal  Communication   Communication: No difficulties  Cognition Arousal/Alertness: Awake/alert Behavior During Therapy: WFL for tasks assessed/performed Overall Cognitive Status: Impaired/Different from baseline                                        General Comments      Exercises     Assessment/Plan    PT Assessment Patient needs continued PT services  PT Problem List Decreased strength;Decreased activity tolerance;Decreased balance;Decreased mobility       PT Treatment Interventions Gait training;Stair training;Functional mobility training;Therapeutic activities;Therapeutic exercise;Patient/family education    PT Goals (Current goals can be found in the Care Plan section)  Acute Rehab PT Goals Patient Stated Goal: return home PT Goal Formulation: With patient Time For Goal Achievement: 06/16/18 Potential to Achieve Goals: Good    Frequency Min 3X/week   Barriers to discharge        Co-evaluation               AM-PAC PT "6 Clicks" Daily Activity  Outcome Measure Difficulty turning over in bed (including adjusting bedclothes, sheets and blankets)?: None Difficulty moving from lying on back to sitting on the side of the bed? : None Difficulty sitting down on and standing up from a chair with arms (e.g., wheelchair, bedside commode, etc,.)?: None Help needed moving to and from a bed to chair (including a wheelchair)?: A Little Help needed walking in hospital room?: A Little Help needed climbing 3-5 steps with a railing? : A Little 6 Click Score: 21    End of Session   Activity Tolerance: Patient tolerated treatment well;Patient limited by fatigue Patient left: in chair;with call bell/phone within reach Nurse Communication: Mobility status PT Visit Diagnosis: Unsteadiness on feet (R26.81);Other abnormalities of gait and mobility (R26.89);Muscle weakness (generalized) (M62.81)    Time: 0981-1914 PT  Time Calculation (min) (ACUTE ONLY): 27 min   Charges:   PT Evaluation $PT Eval Moderate Complexity: 1 Mod PT Treatments $Therapeutic Activity: 23-37 mins   PT G Codes:        3:40 PM, 06/16/18 Ocie Bob, MPT Physical Therapist with [facility] [phone] office [phone] mobile phone

## 2018-06-11 NOTE — Consult Note (Signed)
[place] A. Merlene Laughter, MD     [email or web address]          [name] Rojas is an 77 y.o. female.   ASSESSMENT/PLAN: 1.  Acute onset of severe headaches and dizziness which I think is most likely due to accelerated hypertension.  Vigorous blood pressure control is recommended.  Goal 130/85.  Additional labs will be obtained.  Physical and occupational therapies are recommended.    2.  Chronic pain syndrome     Patient is a 77 year old black female who presents with the acute onset of severe holocephalic headache.  Headaches are associated with dizziness.  The dizziness described as lightheaded sensation.  The patient does not report focal numbness or weakness.  The chart indicate that she may have had some right-sided weakness but the patient reports having more pain involving the right leg.  The patient does not report having associated loss of consciousness or dysarthria. We have seen the patient in the past for chronic pain issues due to low back pain  and fibromyalgia.  There are no reports of dysphasia, diplopia, chest pain or shortness of breath.  The patient reports no blurring of her vision or visual symptoms.  She does report is not feeling right and decided to seek medical attention.  She turned to her blood pressure was quite high 200s over 102.  She tells me her blood pressure typically runs in the 130s/140s over 70s/80s.   GENERAL: This is a pleasant female who appears to be in some discomfort but no acute distress.  HEENT: Supple neck and no evidence of trauma  ABDOMEN: soft  EXTREMITIES: No edema; significant arthritic changes of the knees bilaterally.  There is significant pain with manipulation of the right knee.   BACK: Normal  SKIN: Normal by inspection.    MENTAL STATUS: Alert and oriented. Speech, language and cognition are generally intact. Judgment and insight normal.   CRANIAL NERVES: Pupils are equal, round and reactive to light and  accomodation; extra ocular movements are full, there is no significant nystagmus; visual fields are full; upper and lower facial muscles are normal in strength and symmetric, there is no flattening of the nasolabial folds; tongue is midline; uvula is midline; shoulder elevation is normal.  MOTOR: The right hip flexion and dorsiflexion are slightly weak 4+/5 mostly due to pain.  COORDINATION: Left finger to nose is normal, right finger to nose is normal, No rest tremor; no intention tremor; no postural tremor; no bradykinesia.  REFLEXES: Deep tendon reflexes are symmetrical and normal. Plantar reflexes are flexor bilaterally.   SENSATION: Normal to light touch, temperature, and pain.  The patient does not extinguish double simultaneous stimulation.      Blood pressure (!) 174/141, pulse 82, temperature 98.2 F (36.8 C), temperature source Oral, resp. rate 16, height 5' 4" (1.626 m), weight 170 lb (77.1 kg), SpO2 97 %.  Past Medical History:  Diagnosis Date  . Anemia   . Anxiety   . Arthritis   . Chronic back pain   . Depression   . Fibromyalgia   . GERD (gastroesophageal reflux disease)   . Hypertension   . Osteoporosis   . Stroke [facility])     Past Surgical History:  Procedure Laterality Date  . ABDOMINAL HYSTERECTOMY     bleeding  . BACK SURGERY    . GASTRIC BYPASS    . KNEE SURGERY    . TONSILLECTOMY      Family History  Problem Relation Age of Onset  .  Arthritis Mother   . Asthma Mother   . Heart disease Mother   . Early death Father        trauma  . Heart disease Sister   . Stroke Sister     Social History:  reports that she has never smoked. She has never used smokeless tobacco. She reports that she does not drink alcohol or use drugs.  Allergies:  Allergies  Allergen Reactions  . Codeine Nausea And Vomiting  . Iodinated Diagnostic Agents Itching and Nausea Only  . Lyrica [Pregabalin]     swelling  . Statins Nausea Only    Muscle weakness Muscle  weakness     Medications: Prior to Admission medications   Medication Sig Start Date End Date Taking? Authorizing Provider  ascorbic acid (VITAMIN C) 500 MG tablet Take 1 tablet by mouth daily.   Yes [provider]  aspirin EC 81 MG tablet Take 1 tablet (81 mg total) by mouth daily. Patient taking differently: Take 81 mg by mouth as needed.  08/03/17  Yes Raylene Everts, MD  cholecalciferol (VITAMIN D) 1000 units tablet Take 1,000 Units by mouth daily.   Yes [provider]  Cyanocobalamin (B-12 PO) Take 1 tablet by mouth daily.   Yes [provider]  escitalopram (LEXAPRO) 20 MG tablet Take 1 tablet (20 mg total) by mouth daily. 04/30/18  Yes Hisada, Elie Goody, MD  hydrochlorothiazide (HYDRODIURIL) 25 MG tablet Take 1 tablet (25 mg total) by mouth daily. 08/03/17  Yes Raylene Everts, MD  HYDROcodone-acetaminophen [facility]) 10-325 MG tablet Take 1 tablet by mouth every 6 (six) hours as needed for moderate pain.  11/13/17  Yes [provider]  loratadine (CLARITIN) 10 MG tablet Take 10 mg by mouth daily.   Yes [provider]  naproxen (NAPROSYN) 500 MG tablet Take 1 tablet (500 mg total) by mouth 2 (two) times daily. Patient taking differently: Take 500 mg by mouth daily as needed for moderate pain.  03/06/18  Yes Recardo Evangelist, PA-C  omeprazole (PRILOSEC) 40 MG capsule TAKE 1 CAPSULE BY MOUTH EVERY DAY. Patient taking differently: TAKE 1 CAPSULE BY MOUTH EVERY DAY AS NEEDED FOR ACID REFLUX 10/31/17  Yes Raylene Everts, MD  potassium chloride (KLOR-CON M10) 10 MEQ tablet Take 1 tablet (10 mEq total) by mouth daily. 08/03/17  Yes Raylene Everts, MD  tiZANidine (ZANAFLEX) 4 MG tablet Take 1 tablet (4 mg total) by mouth at bedtime. 06/27/17  Yes Raylene Everts, MD    Scheduled Meds: . aspirin EC  325 mg Oral Daily  . atorvastatin  80 mg Oral q1800  . cholecalciferol  1,000 Units Oral Daily  . enoxaparin (LOVENOX) injection  40 mg  Subcutaneous Q24H  . escitalopram  20 mg Oral Daily  . loratadine  10 mg Oral Daily  . pantoprazole  40 mg Oral Daily  . potassium chloride  10 mEq Oral Daily  . vitamin B-12  100 mcg Oral Daily  . ascorbic acid  500 mg Oral Daily   Continuous Infusions: PRN Meds:.acetaminophen **OR** acetaminophen, hydrALAZINE, HYDROcodone-acetaminophen, ondansetron **OR** ondansetron (ZOFRAN) IV     Results for orders placed or performed during the hospital encounter of 06/10/18 (from the past 48 hour(s))  CBC with Differential     Status: Abnormal   Collection Time: 06/10/18  7:41 PM  Result Value Ref Range   WBC 4.8 4.0 - 10.5 K/uL   RBC 3.35 (L) 3.87 - 5.11 MIL/uL   Hemoglobin 11.3 (  L) 12.0 - 15.0 g/dL   HCT 34.8 (L) 36.0 - 46.0 %   MCV 103.9 (H) 78.0 - 100.0 fL   MCH 33.7 26.0 - 34.0 pg   MCHC 32.5 30.0 - 36.0 g/dL   RDW 13.2 11.5 - 15.5 %   Platelets 201 150 - 400 K/uL   Neutrophils Relative % 51 %   Neutro Abs 2.5 1.7 - 7.7 K/uL   Lymphocytes Relative 36 %   Lymphs Abs 1.7 0.7 - 4.0 K/uL   Monocytes Relative 11 %   Monocytes Absolute 0.5 0.1 - 1.0 K/uL   Eosinophils Relative 2 %   Eosinophils Absolute 0.1 0.0 - 0.7 K/uL   Basophils Relative 0 %   Basophils Absolute 0.0 0.0 - 0.1 K/uL    Comment: Performed at [facility], [address] [place], [place] [address]  Comprehensive metabolic panel     Status: Abnormal   Collection Time: 06/10/18  7:41 PM  Result Value Ref Range   Sodium 139 135 - 145 mmol/L   Potassium 3.4 (L) 3.5 - 5.1 mmol/L   Chloride 104 101 - 111 mmol/L   CO2 27 22 - 32 mmol/L   Glucose, Bld 96 65 - 99 mg/dL   BUN 19 6 - 20 mg/dL   Creatinine, Ser 0.63 0.44 - 1.00 mg/dL   Calcium 9.4 8.9 - 10.3 mg/dL   Total Protein 6.4 (L) 6.5 - 8.1 g/dL   Albumin 4.0 3.5 - 5.0 g/dL   AST 22 15 - 41 U/L   ALT 23 14 - 54 U/L   Alkaline Phosphatase 41 38 - 126 U/L   Total Bilirubin 0.8 0.3 - 1.2 mg/dL   GFR calc non Af Amer >60 >60 mL/min   GFR calc Af Amer >60 >60  mL/min    Comment: (NOTE) The eGFR has been calculated using the CKD EPI equation. This calculation has not been validated in all clinical situations. eGFR's persistently <60 mL/min signify possible Chronic Kidney Disease.    Anion gap 8 5 - 15    Comment: Performed at [facility], [address] [place], [place] [address]  Protime-INR     Status: None   Collection Time: 06/10/18  7:41 PM  Result Value Ref Range   Prothrombin Time 14.4 11.4 - 15.2 seconds   INR 1.12     Comment: Performed at [facility], [address], [place], [place] [address]  APTT     Status: None   Collection Time: 06/10/18  7:41 PM  Result Value Ref Range   aPTT 31 24 - 36 seconds    Comment: Performed at [facility], [address], [place], [place] [address]  Ethanol     Status: None   Collection Time: 06/10/18  7:42 PM  Result Value Ref Range   Alcohol, Ethyl (B) <10 <10 mg/dL    Comment: (NOTE) Lowest detectable limit for serum alcohol is 10 mg/dL. For medical purposes only. Performed at [facility], [address], [place], [place] [address]   I-stat troponin, ED     Status: None   Collection Time: 06/10/18  7:54 PM  Result Value Ref Range   Troponin i, poc 0.02 0.00 - 0.08 ng/mL   Comment 3            Comment: Due to the release kinetics of cTnI, a negative result within the first hours of the onset of symptoms does not rule out myocardial infarction with certainty. If myocardial infarction is still  suspected, repeat the test at appropriate intervals.   Urine rapid drug screen (hosp performed)     Status: Abnormal   Collection Time: 06/10/18 10:30 PM  Result Value Ref Range   Opiates POSITIVE (A) NONE DETECTED   Cocaine NONE DETECTED NONE DETECTED   Benzodiazepines NONE DETECTED NONE DETECTED   Amphetamines NONE DETECTED NONE DETECTED   Tetrahydrocannabinol NONE DETECTED NONE DETECTED   Barbiturates (A) NONE DETECTED    Result not available. Reagent lot number recalled by manufacturer.      Comment: (NOTE) DRUG SCREEN FOR MEDICAL PURPOSES ONLY.  IF CONFIRMATION IS NEEDED FOR ANY PURPOSE, NOTIFY LAB WITHIN 5 DAYS. LOWEST DETECTABLE LIMITS FOR URINE DRUG SCREEN Drug Class                     Cutoff (ng/mL) Amphetamine and metabolites    1000 Barbiturate and metabolites    200 Benzodiazepine                 850 Tricyclics and metabolites     300 Opiates and metabolites        300 Cocaine and metabolites        300 THC                            50 Performed at [place] [place], [place] [address]   Urinalysis, Routine w reflex microscopic     Status: Abnormal   Collection Time: 06/10/18 10:30 PM  Result Value Ref Range   Color, Urine STRAW (A) YELLOW   APPearance CLEAR CLEAR   Specific Gravity, Urine 1.009 1.005 - 1.030   pH 6.0 5.0 - 8.0   Glucose, UA NEGATIVE NEGATIVE mg/dL   Hgb urine dipstick NEGATIVE NEGATIVE   Bilirubin Urine NEGATIVE NEGATIVE   Ketones, ur NEGATIVE NEGATIVE mg/dL   Protein, ur NEGATIVE NEGATIVE mg/dL   Nitrite NEGATIVE NEGATIVE   Leukocytes, UA NEGATIVE NEGATIVE    Comment: Performed at [facility], [address], [place], [place] [address]  Hemoglobin A1c     Status: None   Collection Time: 06/11/18  6:32 AM  Result Value Ref Range   Hgb A1c MFr Bld 5.3 4.8 - 5.6 %    Comment: (NOTE) Pre diabetes:          5.7%-6.4% Diabetes:              >6.4% Glycemic control for   <7.0% adults with diabetes    Mean Plasma Glucose 105.41 mg/dL    Comment: Performed at [facility] [address] [place], [place] [address]  Lipid panel     Status: None   Collection Time: 06/11/18  6:32 AM  Result Value Ref Range   Cholesterol 136 0 - 200 mg/dL   Triglycerides 49 <150 mg/dL   HDL 46 >40 mg/dL   Total CHOL/HDL Ratio 3.0 RATIO   VLDL 10 0 - 40 mg/dL   LDL Cholesterol 80 0 - 99 mg/dL    Comment:        Total Cholesterol/HDL:CHD Risk Coronary Heart Disease Risk Table                     Men   Women  1/2 Average  Risk   3.4   3.3  Average Risk       5.0   4.4  2 X Average Risk   9.6  7.1  3 X Average Risk  23.4   11.0        Use the calculated Patient Ratio above and the CHD Risk Table to determine the patient's CHD Risk.        ATP III CLASSIFICATION (LDL):  <100     mg/dL   Optimal  100-129  mg/dL   Near or Above                    Optimal  130-159  mg/dL   Borderline  160-189  mg/dL   High  >190     mg/dL   Very High Performed at [facility] [place], [place] [address]   Basic metabolic panel     Status: Abnormal   Collection Time: 06/11/18  6:32 AM  Result Value Ref Range   Sodium 143 135 - 145 mmol/L   Potassium 4.3 3.5 - 5.1 mmol/L    Comment: DELTA CHECK NOTED   Chloride 106 101 - 111 mmol/L   CO2 31 22 - 32 mmol/L   Glucose, Bld 108 (H) 65 - 99 mg/dL   BUN 17 6 - 20 mg/dL   Creatinine, Ser 0.69 0.44 - 1.00 mg/dL   Calcium 9.3 8.9 - 10.3 mg/dL   GFR calc non Af Amer >60 >60 mL/min   GFR calc Af Amer >60 >60 mL/min    Comment: (NOTE) The eGFR has been calculated using the CKD EPI equation. This calculation has not been validated in all clinical situations. eGFR's persistently <60 mL/min signify possible Chronic Kidney Disease.    Anion gap 6 5 - 15    Comment: Performed at [facility], [address] [place], [place] [address]  CBC     Status: Abnormal   Collection Time: 06/11/18  6:32 AM  Result Value Ref Range   WBC 3.6 (L) 4.0 - 10.5 K/uL   RBC 3.25 (L) 3.87 - 5.11 MIL/uL   Hemoglobin 10.9 (L) 12.0 - 15.0 g/dL   HCT 34.2 (L) 36.0 - 46.0 %   MCV 105.2 (H) 78.0 - 100.0 fL   MCH 33.5 26.0 - 34.0 pg   MCHC 31.9 30.0 - 36.0 g/dL   RDW 13.2 11.5 - 15.5 %   Platelets 185 150 - 400 K/uL    Comment: Performed at [facility], [address], [place], [place] [address]  Troponin I (q 6hr x 3)     Status: Abnormal   Collection Time: 06/11/18  8:54 AM  Result Value Ref Range   Troponin I 0.03 (HH) <0.03 ng/mL    Comment: CRITICAL RESULT CALLED TO, READ  BACK BY AND VERIFIED WITH: [facility] 06/11/18 _0  [id number] Performed at [facility], [address], [place], [place] [address]   Troponin I (q 6hr x 3)     Status: Abnormal   Collection Time: 06/11/18  3:00 PM  Result Value Ref Range   Troponin I 0.03 (HH) <0.03 ng/mL    Comment: CRITICAL VALUE NOTED.  VALUE IS CONSISTENT WITH PREVIOUSLY REPORTED AND CALLED VALUE. Performed at [facility], [address], [place], [place] [address]     Studies/Results:  ECHO - Left ventricle: The cavity size was normal. Wall thickness was   increased increased in a pattern of mild to moderate LVH.   Systolic function was normal. The estimated ejection fraction was   in the range of 60% to 65%. Wall motion was normal; there were no   regional wall motion abnormalities.  Doppler parameters are   consistent with abnormal left ventricular relaxation (grade 1   diastolic dysfunction). - Aortic valve: Mildly calcified annulus. Trileaflet; mildly   thickened leaflets. Valve area (VTI): 2.95 cm^2. Valve area   (Vmax): 2.3 cm^2. Valve area (Vmean): 2.28 cm^2. - Mitral valve: Mildly calcified annulus. Mildly thickened leaflets   . There was mild regurgitation. - Left atrium: The atrium was moderately dilated. - Right atrium: The atrium was moderately dilated. - Technically adequate study.       THE CAROTID DOPPLERS UNREMARKABLE    BRAIN MRI FINDINGS: Brain: No acute infarction, hemorrhage, hydrocephalus, extra-axial collection or mass lesion. Patchy FLAIR hyperintensity in the cerebral white matter and deep gray nuclei attributed to chronic small vessel ischemia, overall mild for age. Dilated perivascular spaces accentuate T2 hyperintensity within the basal ganglia. Normal brain volume.  Vascular: Major flow voids are preserved  Skull and upper cervical spine: No evidence of marrow lesion.  Sinuses/Orbits: Partial right mastoid opacification with negative nasopharynx. Based on  prior head CT this partial opacification is favored chronic.  IMPRESSION: 1. No acute finding. 2. Mild for age chronic small vessel ischemia. 3. Partial right mastoid opacification with negative nasopharynx, likely chronic.      The brain MRI scan is reviewed in person.  No acute lesions are seen on DWI.  No hemorrhages appreciated.  There are multiple small to moderate size white matter signal seen on FLAIR imaging with some perpendicular to the ventricle.   There is also the typical moderate confluent periventricular signal seen on FLAIR.  Some of these white matter lesions are associated with low signal on T1 without clear evidence of encephalomalacia.    Waleed Dettman A. Merlene Laughter, M.D.  Diplomate, Tax adviser of Psychiatry and Neurology ( Neurology). 06/11/2018, 6:18 PM

## 2018-06-11 NOTE — Progress Notes (Addendum)
PROGRESS NOTE  [name] Rojas ZOX:[id number] DOB: 02-15-1941 DOA: 06/10/2018 PCP: Benita Stabile, MD  Brief History:  77 year old female with history of fibromyalgia, chronic back pain, hypertension, stroke with residual left hemiparesis, GERD presenting with right frontal headache, dizziness, and gait instability that began when she woke up in the morning of 06/10/2018.  The patient had been also complaining of some subjective chills.  She has some chest discomfort on the evening of 06/10/2018 with some associated shortness of breath.  She states that it was a dull sensation lasting only a few minutes.  There is no exacerbating or alleviating factors.  The patient felt that moving and getting from a supine position made her dizziness worse.  As result, the patient presented emergency department for further evaluation.  Patient denied any nausea, vomiting, diarrhea, abdominal pain, dysuria, hematuria.  The patient denies any falling or syncope.  Apparently, the patient checked her blood pressure at home and it was noted to be elevated.  In the emergency department, the patient was noted to be afebrile hemodynamically stable.  Initial blood pressure was 168/102, 100% on room air.  Potassium was 3.4.  Otherwise BMP, LFTs, and CBC were essentially unremarkable.  Urinalysis was negative.  Urine drug screen positive for opiates.  EKG shows sinus rhythm with nonspecific ST changes.  Patient was admitted for further evaluation of possible stroke.  Assessment/Plan: Headache/vertigo -Dix-Hallpike maneuver negative -Check orthostatic vitals -EEG -Neurology Consult -PT/OT evaluation -Speech therapy eval -CT brain--neg for acute findings -MRI brain-- -MRA brain-- -Carotid Duplex-- -Echo-- -LDL-- -HbA1C-- -Antiplatelet--ASA 325 mg  Fibromyalgia/chronic back pain -The [place] Controlled Substance Reporting System has been queried for this patient -no red flags -Continue home dose of  hydrocodone -Continue Lexapro  Hyperlipidemia -Continue statin  Essential hypertension -Holding HCTZ to allow for permissive hypertension  Hypokalemia -Replete -Check magnesium  Atypical chest pain -Personally reviewed EKG--sinus rhythm, nonspecific ST changes -Cycle troponins -Echocardiogram    Disposition Plan:   Home when cleared by neurology Family Communication:   No Family at bedside  Consultants:  neurology  Code Status:  FULL  DVT Prophylaxis:  [place] Lovenox   Procedures: As Listed in Progress Note Above  Antibiotics: None    Subjective: Patient states that she still feel a bit dizzy but it is improved from yesterday.  She has a bifrontal headache.  She denies any nausea, vomiting, diarrhea, chest pain, shortness breath, dysuria, hematuria, abdominal pain.  There is no hematochezia or melena.  Objective: Vitals:   06/11/18 0315 06/11/18 0400 06/11/18 0500 06/11/18 0600  BP:  136/87 (!) 148/101 139/87  Pulse: 61 68 68 76  Resp:      Temp:      TempSrc:      SpO2: 100% 100% 99% 100%  Weight:      Height:       No intake or output data in the 24 hours ending 06/11/18 0733 Weight change:  Exam:   General:  Pt is alert, follows commands appropriately, not in acute distress  HEENT: No icterus, No thrush, No neck mass, [place]/AT  Cardiovascular: RRR, S1/S2, no rubs, no gallops  Respiratory: CTA bilaterally, no wheezing, no crackles, no rhonchi  Abdomen: Soft/+BS, non tender, non distended, no guarding  Extremities: No edema, No lymphangitis, No petechiae, No rashes, no synovitis  Neuro:  CN II-XII intact, strength 4/5 in RUE, RLE, strength 4/5 LUE, LLE; sensation intact bilateral; no dysmetria; babinski equivocal  Data Reviewed: I have personally reviewed following labs and imaging studies Basic Metabolic Panel: Recent Labs  Lab 06/10/18 1941 06/11/18 0632  NA 139 143  K 3.4* 4.3  CL 104 106  CO2 27 31  GLUCOSE 96 108*  BUN 19 17    CREATININE 0.63 0.69  CALCIUM 9.4 9.3   Liver Function Tests: Recent Labs  Lab 06/10/18 1941  AST 22  ALT 23  ALKPHOS 41  BILITOT 0.8  PROT 6.4*  ALBUMIN 4.0   No results for input(s): LIPASE, AMYLASE in the last 168 hours. No results for input(s): AMMONIA in the last 168 hours. Coagulation Profile: Recent Labs  Lab 06/10/18 1941  INR 1.12   CBC: Recent Labs  Lab 06/10/18 1941 06/11/18 0632  WBC 4.8 3.6*  NEUTROABS 2.5  --   HGB 11.3* 10.9*  HCT 34.8* 34.2*  MCV 103.9* 105.2*  PLT 201 185   Cardiac Enzymes: No results for input(s): CKTOTAL, CKMB, CKMBINDEX, TROPONINI in the last 168 hours. BNP: Invalid input(s): POCBNP CBG: No results for input(s): GLUCAP in the last 168 hours. HbA1C: No results for input(s): HGBA1C in the last 72 hours. Urine analysis:    Component Value Date/Time   COLORURINE STRAW (A) 06/10/2018 2230   APPEARANCEUR CLEAR 06/10/2018 2230   LABSPEC 1.009 06/10/2018 2230   PHURINE 6.0 06/10/2018 2230   GLUCOSEU NEGATIVE 06/10/2018 2230   HGBUR NEGATIVE 06/10/2018 2230   BILIRUBINUR NEGATIVE 06/10/2018 2230   BILIRUBINUR neg 11/13/2017 1056   KETONESUR NEGATIVE 06/10/2018 2230   PROTEINUR NEGATIVE 06/10/2018 2230   UROBILINOGEN 0.2 11/13/2017 1056   NITRITE NEGATIVE 06/10/2018 2230   LEUKOCYTESUR NEGATIVE 06/10/2018 2230   Sepsis Labs: @LABRCNTIP (procalcitonin:4,lacticidven:4) )No results found for this or any previous visit (from the past 240 hour(s)).   Scheduled Meds: .  stroke: mapping our early stages of recovery book   Does not apply Once  . aspirin EC  325 mg Oral Daily  . atorvastatin  80 mg Oral q1800  . cholecalciferol  1,000 Units Oral Daily  . enoxaparin (LOVENOX) injection  40 mg Subcutaneous Q24H  . escitalopram  20 mg Oral Daily  . loratadine  10 mg Oral Daily  . pantoprazole  40 mg Oral Daily  . potassium chloride  10 mEq Oral Daily  . vitamin B-12   Oral Daily  . ascorbic acid  500 mg Oral Daily    Continuous Infusions: . lactated ringers 75 mL/hr at 06/10/18 2232    Procedures/Studies: Dg Chest 2 View  Result Date: 06/10/2018 CLINICAL DATA:  Dizziness and headache.  Shortness of breath. EXAM: CHEST - 2 VIEW COMPARISON:  Chest x-ray dated February 01, 2017. FINDINGS: The heart size and mediastinal contours are within normal limits. Normal pulmonary vascularity. Atherosclerotic calcification of the aortic arch. No focal consolidation, pleural effusion, or pneumothorax. No acute osseous abnormality. IMPRESSION: 1.  No active cardiopulmonary disease. 2.  Aortic atherosclerosis (ICD10-I70.0). Electronically Signed   By: Obie Dredge M.D.   On: 06/10/2018 19:07   Ct Head Wo Contrast  Result Date: 06/10/2018 CLINICAL DATA:  Headache. EXAM: CT HEAD WITHOUT CONTRAST TECHNIQUE: Contiguous axial images were obtained from the base of the skull through the vertex without intravenous contrast. COMPARISON:  None. FINDINGS: Brain: Mild chronic ischemic white matter disease is noted. No mass effect or midline shift is noted. Ventricular size is within normal limits. There is no evidence of mass lesion, hemorrhage or acute infarction. Vascular: No hyperdense vessel or unexpected calcification. Skull: Normal. Negative  for fracture or focal lesion. Sinuses/Orbits: No acute finding. Other: None. IMPRESSION: Mild chronic ischemic white matter disease. No acute intracranial abnormality seen. Electronically Signed   By: Lupita RaiderJames  Green Jr, M.D.   On: 06/10/2018 19:02    Catarina Hartshornavid Kerry-Anne Mezo, DO  [facility] Pager [phone]  If 7PM-7AM, please contact night-coverage [email or web address] Password [email or web address] 06/11/2018, 7:33 AM   LOS: 0 days

## 2018-06-11 NOTE — Plan of Care (Signed)
  Problem: Acute Rehab PT Goals(only PT should resolve) Goal: Patient Will Transfer Sit To/From Stand Outcome: Progressing Flowsheets (Taken 06/11/2018 1541) Patient will transfer sit to/from stand: with modified independence Goal: Pt Will Transfer Bed To Chair/Chair To Bed Outcome: Progressing Flowsheets (Taken 06/11/2018 1541) Pt will Transfer Bed to Chair/Chair to Bed: with modified independence Goal: Pt Will Ambulate Outcome: Progressing Flowsheets (Taken 06/11/2018 1541) Pt will Ambulate: with modified independence;75 feet;with cane   3:42 PM, 06/11/18 Ocie BobJames Huntley Knoop, MPT Physical Therapist with [facility] [phone] office [phone] mobile phone

## 2018-06-12 ENCOUNTER — Observation Stay (HOSPITAL_COMMUNITY): Payer: Medicare Other

## 2018-06-12 DIAGNOSIS — R42 Dizziness and giddiness: Secondary | ICD-10-CM | POA: Diagnosis not present

## 2018-06-12 DIAGNOSIS — I69952 Hemiplegia and hemiparesis following unspecified cerebrovascular disease affecting left dominant side: Secondary | ICD-10-CM | POA: Diagnosis not present

## 2018-06-12 DIAGNOSIS — I10 Essential (primary) hypertension: Secondary | ICD-10-CM | POA: Diagnosis not present

## 2018-06-12 DIAGNOSIS — M797 Fibromyalgia: Secondary | ICD-10-CM | POA: Diagnosis not present

## 2018-06-12 DIAGNOSIS — R51 Headache: Secondary | ICD-10-CM | POA: Diagnosis not present

## 2018-06-12 DIAGNOSIS — R2681 Unsteadiness on feet: Secondary | ICD-10-CM | POA: Diagnosis not present

## 2018-06-12 DIAGNOSIS — G4452 New daily persistent headache (NDPH): Secondary | ICD-10-CM | POA: Diagnosis not present

## 2018-06-12 LAB — CBC
HCT: 33.7 % — ABNORMAL LOW (ref 36.0–46.0)
Hemoglobin: 10.9 g/dL — ABNORMAL LOW (ref 12.0–15.0)
MCH: 34.3 pg — AB (ref 26.0–34.0)
MCHC: 32.3 g/dL (ref 30.0–36.0)
MCV: 106 fL — AB (ref 78.0–100.0)
Platelets: 183 10*3/uL (ref 150–400)
RBC: 3.18 MIL/uL — ABNORMAL LOW (ref 3.87–5.11)
RDW: 13.2 % (ref 11.5–15.5)
WBC: 3.7 10*3/uL — AB (ref 4.0–10.5)

## 2018-06-12 LAB — BASIC METABOLIC PANEL
Anion gap: 6 (ref 5–15)
BUN: 14 mg/dL (ref 8–23)
CHLORIDE: 107 mmol/L (ref 98–111)
CO2: 31 mmol/L (ref 22–32)
CREATININE: 0.78 mg/dL (ref 0.44–1.00)
Calcium: 9 mg/dL (ref 8.9–10.3)
GFR calc Af Amer: >60 mL/min (ref >60)
GFR calc non Af Amer: >60 mL/min (ref >60)
Glucose, Bld: 106 mg/dL — ABNORMAL HIGH (ref 70–99)
POTASSIUM: 3.8 mmol/L (ref 3.5–5.1)
Sodium: 144 mmol/L (ref 135–145)

## 2018-06-12 LAB — SEDIMENTATION RATE: Sed Rate: 2 mm/hr (ref 0–22)

## 2018-06-12 LAB — C-REACTIVE PROTEIN: CRP: <0.8 mg/dL (ref <1.0)

## 2018-06-12 MED ORDER — SODIUM CHLORIDE 0.9 % IV SOLN
Freq: Once | INTRAVENOUS | Status: DC
  Filled 2018-06-12: qty 2

## 2018-06-12 MED ORDER — BUTALBITAL-APAP-CAFFEINE 50-325-40 MG PO TABS: 2.0000 | ORAL_TABLET | ORAL | 0 refills | Status: DC | PRN

## 2018-06-12 MED ORDER — DEXAMETHASONE SODIUM PHOSPHATE 10 MG/ML IJ SOLN: 20.0000 mg | Freq: Once | INTRAMUSCULAR | Status: DC

## 2018-06-12 MED ORDER — BUTALBITAL-APAP-CAFFEINE 50-325-40 MG PO TABS
2.0000 | ORAL_TABLET | ORAL | Status: DC | PRN
  Administered 2018-06-12 (×2): 2 via ORAL
  Filled 2018-06-12 (×2): qty 2

## 2018-06-12 MED ORDER — ESCITALOPRAM OXALATE 10 MG PO TABS: 10.0000 mg | ORAL_TABLET | Freq: Every day | ORAL | 0 refills | Status: DC

## 2018-06-12 MED ORDER — DEXAMETHASONE 4 MG PO TABS
12.0000 mg | ORAL_TABLET | Freq: Once | ORAL | Status: AC
  Administered 2018-06-12: 12 mg via ORAL
  Filled 2018-06-12: qty 3

## 2018-06-12 NOTE — Progress Notes (Signed)
[place] A. [name] Laughter, MD     [email or web address]          [name] Rojas is an 77 y.o. female.   Assessment/Plan:  1.  Acute onset of severe headaches and dizziness which I think is most likely due to accelerated hypertension.  Vigorous blood pressure control is recommended.  Goal 130/85.  Additional labs will be obtained.  Physical and occupational therapies are recommended.   MRA was obtained and this was unrevealing.  I will try the patient on the bolus dose of steroids x1.  2.  Chronic pain syndrome  3.  Longstanding history of headaches with the worsening now possibly indicating analgesic overuse headaches in baseline migraine headache patient: Treatment as above.   The patient continues to report significant severe headaches.  Headaches are bifrontal but more to the left and 0 of along the center.  She was given Fioricet which seems to help.  She tells me that she takes a lot of BC Powder at home for her headaches.  This raises the possibility of Analgesic overuse overuse headaches.   GENERAL: This is a pleasant female who appears to be in some discomfort but no acute distress.  HEENT: Supple neck and no evidence of trauma  EXTREMITIES: No edema; significant arthritic changes of the knees bilaterally.  There is significant pain with manipulation of the right knee.   BACK: Normal  SKIN: Normal by inspection.    MENTAL STATUS: Alert and oriented. Speech, language and cognition are generally intact. Judgment and insight normal.   CRANIAL NERVES: Pupils are equal, round and reactive to light and accomodation; extra ocular movements are full, there is no significant nystagmus; visual fields are full; upper and lower facial muscles are normal in strength and symmetric, there is no flattening of the nasolabial folds; tongue is midline; uvula is midline; shoulder elevation is normal.  MOTOR: The right hip flexion and dorsiflexion are slightly weak 4+/5  mostly due to pain.  COORDINATION: Left finger to nose is normal, right finger to nose is normal, No rest tremor; no intention tremor; no postural tremor; no bradykinesia.          Objective: Vital signs in last 24 hours: Temp:  [98.1 F (36.7 C)-98.5 F (36.9 C)] 98.1 F (36.7 C) (06/25 0945) Pulse Rate:  [63-82] 81 (06/25 1345) Resp:  [16-18] 18 (06/25 1345) BP: (129-174)/(68-141) 145/119 (06/25 1345) SpO2:  [97 %-100 %] 100 % (06/25 1345)  Intake/Output from previous day: 06/24 0701 - 06/25 0700 In: 480 [P.O.:480] Out: -  Intake/Output this shift: No intake/output data recorded. Nutritional status:  Diet Order           Diet Heart Room service appropriate? Yes; Fluid consistency: Thin  Diet effective now           Lab Results: Results for orders placed or performed during the hospital encounter of 06/10/18 (from the past 48 hour(s))  CBC with Differential     Status: Abnormal   Collection Time: 06/10/18  7:41 PM  Result Value Ref Range   WBC 4.8 4.0 - 10.5 K/uL   RBC 3.35 (L) 3.87 - 5.11 MIL/uL   Hemoglobin 11.3 (L) 12.0 - 15.0 g/dL   HCT 34.8 (L) 36.0 - 46.0 %   MCV 103.9 (H) 78.0 - 100.0 fL   MCH 33.7 26.0 - 34.0 pg   MCHC 32.5 30.0 - 36.0 g/dL   RDW 13.2 11.5 - 15.5 %   Platelets 201 150 - 400 K/uL  Neutrophils Relative % 51 %   Neutro Abs 2.5 1.7 - 7.7 K/uL   Lymphocytes Relative 36 %   Lymphs Abs 1.7 0.7 - 4.0 K/uL   Monocytes Relative 11 %   Monocytes Absolute 0.5 0.1 - 1.0 K/uL   Eosinophils Relative 2 %   Eosinophils Absolute 0.1 0.0 - 0.7 K/uL   Basophils Relative 0 %   Basophils Absolute 0.0 0.0 - 0.1 K/uL    Comment: Performed at [facility], [address] [place], [place] [address]  Comprehensive metabolic panel     Status: Abnormal   Collection Time: 06/10/18  7:41 PM  Result Value Ref Range   Sodium 139 135 - 145 mmol/L   Potassium 3.4 (L) 3.5 - 5.1 mmol/L   Chloride 104 101 - 111 mmol/L   CO2 27 22 - 32 mmol/L   Glucose,  Bld 96 65 - 99 mg/dL   BUN 19 6 - 20 mg/dL   Creatinine, Ser 0.63 0.44 - 1.00 mg/dL   Calcium 9.4 8.9 - 10.3 mg/dL   Total Protein 6.4 (L) 6.5 - 8.1 g/dL   Albumin 4.0 3.5 - 5.0 g/dL   AST 22 15 - 41 U/L   ALT 23 14 - 54 U/L   Alkaline Phosphatase 41 38 - 126 U/L   Total Bilirubin 0.8 0.3 - 1.2 mg/dL   GFR calc non Af Amer >60 >60 mL/min   GFR calc Af Amer >60 >60 mL/min    Comment: (NOTE) The eGFR has been calculated using the CKD EPI equation. This calculation has not been validated in all clinical situations. eGFR's persistently <60 mL/min signify possible Chronic Kidney Disease.    Anion gap 8 5 - 15    Comment: Performed at [facility], [address], [place], [place] [address]  Protime-INR     Status: None   Collection Time: 06/10/18  7:41 PM  Result Value Ref Range   Prothrombin Time 14.4 11.4 - 15.2 seconds   INR 1.12     Comment: Performed at [facility], [address] [place], [place] [address]  APTT     Status: None   Collection Time: 06/10/18  7:41 PM  Result Value Ref Range   aPTT 31 24 - 36 seconds    Comment: Performed at [facility], [address], [place], [place] [address]  Ethanol     Status: None   Collection Time: 06/10/18  7:42 PM  Result Value Ref Range   Alcohol, Ethyl (B) <10 <10 mg/dL    Comment: (NOTE) Lowest detectable limit for serum alcohol is 10 mg/dL. For medical purposes only. Performed at [facility], [address], [place], [place] [address]   I-stat troponin, ED     Status: None   Collection Time: 06/10/18  7:54 PM  Result Value Ref Range   Troponin i, poc 0.02 0.00 - 0.08 ng/mL   Comment 3            Comment: Due to the release kinetics of cTnI, a negative result within the first hours of the onset of symptoms does not rule out myocardial infarction with certainty. If myocardial infarction is still suspected, repeat the test at appropriate intervals.   Urine rapid drug screen (hosp performed)     Status: Abnormal    Collection Time: 06/10/18 10:30 PM  Result Value Ref Range   Opiates POSITIVE (A) NONE DETECTED   Cocaine NONE DETECTED NONE DETECTED   Benzodiazepines NONE DETECTED NONE DETECTED  Amphetamines NONE DETECTED NONE DETECTED   Tetrahydrocannabinol NONE DETECTED NONE DETECTED   Barbiturates (A) NONE DETECTED    Result not available. Reagent lot number recalled by manufacturer.    Comment: (NOTE) DRUG SCREEN FOR MEDICAL PURPOSES ONLY.  IF CONFIRMATION IS NEEDED FOR ANY PURPOSE, NOTIFY LAB WITHIN 5 DAYS. LOWEST DETECTABLE LIMITS FOR URINE DRUG SCREEN Drug Class                     Cutoff (ng/mL) Amphetamine and metabolites    1000 Barbiturate and metabolites    200 Benzodiazepine                 297 Tricyclics and metabolites     300 Opiates and metabolites        300 Cocaine and metabolites        300 THC                            50 Performed at [facility] [place], [place] [address]   Urinalysis, Routine w reflex microscopic     Status: Abnormal   Collection Time: 06/10/18 10:30 PM  Result Value Ref Range   Color, Urine STRAW (A) YELLOW   APPearance CLEAR CLEAR   Specific Gravity, Urine 1.009 1.005 - 1.030   pH 6.0 5.0 - 8.0   Glucose, UA NEGATIVE NEGATIVE mg/dL   Hgb urine dipstick NEGATIVE NEGATIVE   Bilirubin Urine NEGATIVE NEGATIVE   Ketones, ur NEGATIVE NEGATIVE mg/dL   Protein, ur NEGATIVE NEGATIVE mg/dL   Nitrite NEGATIVE NEGATIVE   Leukocytes, UA NEGATIVE NEGATIVE    Comment: Performed at [facility], [address] [place], [place] [address]  Hemoglobin A1c     Status: None   Collection Time: 06/11/18  6:32 AM  Result Value Ref Range   Hgb A1c MFr Bld 5.3 4.8 - 5.6 %    Comment: (NOTE) Pre diabetes:          5.7%-6.4% Diabetes:              >6.4% Glycemic control for   <7.0% adults with diabetes    Mean Plasma Glucose 105.41 mg/dL    Comment: Performed at [facility] [address] [place], [place] [address]  Lipid panel      Status: None   Collection Time: 06/11/18  6:32 AM  Result Value Ref Range   Cholesterol 136 0 - 200 mg/dL   Triglycerides 49 <150 mg/dL   HDL 46 >40 mg/dL   Total CHOL/HDL Ratio 3.0 RATIO   VLDL 10 0 - 40 mg/dL   LDL Cholesterol 80 0 - 99 mg/dL    Comment:        Total Cholesterol/HDL:CHD Risk Coronary Heart Disease Risk Table                     Men   Women  1/2 Average Risk   3.4   3.3  Average Risk       5.0   4.4  2 X Average Risk   9.6   7.1  3 X Average Risk  23.4   11.0        Use the calculated Patient Ratio above and the CHD Risk Table to determine the patient's CHD Risk.        ATP III CLASSIFICATION (LDL):  <100     mg/dL   Optimal  100-129  mg/dL   Near or Above                    Optimal  130-159  mg/dL   Borderline  160-189  mg/dL   High  >190     mg/dL   Very High Performed at [facility] [place], [place] [address]   Basic metabolic panel     Status: Abnormal   Collection Time: 06/11/18  6:32 AM  Result Value Ref Range   Sodium 143 135 - 145 mmol/L   Potassium 4.3 3.5 - 5.1 mmol/L    Comment: DELTA CHECK NOTED   Chloride 106 101 - 111 mmol/L   CO2 31 22 - 32 mmol/L   Glucose, Bld 108 (H) 65 - 99 mg/dL   BUN 17 6 - 20 mg/dL   Creatinine, Ser 0.69 0.44 - 1.00 mg/dL   Calcium 9.3 8.9 - 10.3 mg/dL   GFR calc non Af Amer >60 >60 mL/min   GFR calc Af Amer >60 >60 mL/min    Comment: (NOTE) The eGFR has been calculated using the CKD EPI equation. This calculation has not been validated in all clinical situations. eGFR's persistently <60 mL/min signify possible Chronic Kidney Disease.    Anion gap 6 5 - 15    Comment: Performed at [facility], [address], [place], [place] [address]  CBC     Status: Abnormal   Collection Time: 06/11/18  6:32 AM  Result Value Ref Range   WBC 3.6 (L) 4.0 - 10.5 K/uL   RBC 3.25 (L) 3.87 - 5.11 MIL/uL   Hemoglobin 10.9 (L) 12.0 - 15.0 g/dL   HCT 34.2 (L) 36.0 - 46.0 %   MCV 105.2 (H) 78.0 - 100.0 fL    MCH 33.5 26.0 - 34.0 pg   MCHC 31.9 30.0 - 36.0 g/dL   RDW 13.2 11.5 - 15.5 %   Platelets 185 150 - 400 K/uL    Comment: Performed at [facility], [address], [place], [place] [address]  Troponin I (q 6hr x 3)     Status: Abnormal   Collection Time: 06/11/18  8:54 AM  Result Value Ref Range   Troponin I 0.03 (HH) <0.03 ng/mL    Comment: CRITICAL RESULT CALLED TO, READ BACK BY AND VERIFIED WITH: CRIS [facility] 06/11/18 _0  [id number] Performed at [facility], [address], [place], [place] [address]   Troponin I (q 6hr x 3)     Status: Abnormal   Collection Time: 06/11/18  3:00 PM  Result Value Ref Range   Troponin I 0.03 (HH) <0.03 ng/mL    Comment: CRITICAL VALUE NOTED.  VALUE IS CONSISTENT WITH PREVIOUSLY REPORTED AND CALLED VALUE. Performed at [facility], [address] [place], [place] [address]   Troponin I (q 6hr x 3)     Status: Abnormal   Collection Time: 06/11/18  8:01 PM  Result Value Ref Range   Troponin I 0.03 (HH) <0.03 ng/mL    Comment: CRITICAL VALUE NOTED.  VALUE IS CONSISTENT WITH PREVIOUSLY REPORTED AND CALLED VALUE. Performed at [facility], [address] [place], [place] [address]   TSH     Status: None   Collection Time: 06/11/18  8:01 PM  Result Value Ref Range   TSH 0.532 0.350 - 4.500 uIU/mL    Comment: Performed by a 3rd Generation assay with a functional sensitivity of <=0.01 uIU/mL. Performed at [facility], [address], [place], [place] [address]  Basic metabolic panel     Status: Abnormal   Collection Time: 06/12/18  5:11 AM  Result Value Ref Range   Sodium 144 135 - 145 mmol/L   Potassium 3.8 3.5 - 5.1 mmol/L   Chloride 107 98 - 111 mmol/L   CO2 31 22 - 32 mmol/L   Glucose, Bld 106 (H) 70 - 99 mg/dL   BUN 14 8 - 23 mg/dL   Creatinine, Ser 0.78 0.44 - 1.00 mg/dL   Calcium 9.0 8.9 - 10.3 mg/dL   GFR calc non Af Amer >60 >60 mL/min   GFR calc Af Amer >60 >60 mL/min    Comment: (NOTE) The eGFR has been calculated using the CKD  EPI equation. This calculation has not been validated in all clinical situations. eGFR's persistently <60 mL/min signify possible Chronic Kidney Disease.    Anion gap 6 5 - 15    Comment: Performed at [facility], [address], [place], [place] [address]  CBC     Status: Abnormal   Collection Time: 06/12/18  5:11 AM  Result Value Ref Range   WBC 3.7 (L) 4.0 - 10.5 K/uL   RBC 3.18 (L) 3.87 - 5.11 MIL/uL   Hemoglobin 10.9 (L) 12.0 - 15.0 g/dL   HCT 33.7 (L) 36.0 - 46.0 %   MCV 106.0 (H) 78.0 - 100.0 fL   MCH 34.3 (H) 26.0 - 34.0 pg   MCHC 32.3 30.0 - 36.0 g/dL   RDW 13.2 11.5 - 15.5 %   Platelets 183 150 - 400 K/uL    Comment: Performed at [facility], [address] [place], [place] [address]  Sedimentation rate     Status: None   Collection Time: 06/12/18  5:11 AM  Result Value Ref Range   Sed Rate 2 0 - 22 mm/hr    Comment: Performed at [facility], [address] [place], [place] [address]    Lipid Panel Recent Labs    06/11/18 0632  CHOL 136  TRIG 49  HDL 46  CHOLHDL 3.0  VLDL 10  LDLCALC 80    Studies/Results:   Medications:  Scheduled Meds: . aspirin EC  325 mg Oral Daily  . atorvastatin  80 mg Oral q1800  . cholecalciferol  1,000 Units Oral Daily  . enoxaparin (LOVENOX) injection  40 mg Subcutaneous Q24H  . escitalopram  20 mg Oral Daily  . loratadine  10 mg Oral Daily  . pantoprazole  40 mg Oral Daily  . potassium chloride  10 mEq Oral Daily  . vitamin B-12  100 mcg Oral Daily  . ascorbic acid  500 mg Oral Daily   Continuous Infusions: PRN Meds:.butalbital-acetaminophen-caffeine, hydrALAZINE, HYDROcodone-acetaminophen, ondansetron **OR** ondansetron (ZOFRAN) IV     LOS: 0 days   Stepanie Graver A. [name] Rojas, M.D.  Diplomate, Tax adviser of Psychiatry and Neurology ( Neurology).

## 2018-06-12 NOTE — Progress Notes (Signed)
She has persistent severe headaches.  MRA will be obtained.  Fioricet will be initiated for headaches for symptomatic treatment.

## 2018-06-12 NOTE — Progress Notes (Signed)
IV removed per patient. D/C instructions given to pt, verbalized understanding. Pt son at bedside to transport home.

## 2018-06-12 NOTE — Evaluation (Signed)
Occupational Therapy Evaluation Patient Details Name: [name] Rojas MRN: [id number] DOB: [birth year] Today's Date: 06/12/2018    History of Present Illness [name] Rojas is a 77 y.o. female with medical history significant for chronic low back pain, fibromyalgia, arthritis, hypertension, prior CVA 15 years ago, hypertension, and GERD who presented to the emergency department after waking up earlier this morning at approximately 4 AM with a dull, achy, frontal headache with holocranial pressure.  This was accompanied with some dizziness and sensation of the room shifting slightly.  She denies any weakness or numbness to her extremities and has had no difficulty with her speech but claims to be very unsteady on her feet.  She did have some higher sodium foods yesterday and checked her blood pressure at home and this was noted to be quite elevated.  She called EMS and was brought to ED for further evaluation.  She does have some very mild left-sided deficits from her prior CVA and was supposed to take aspirin 81 mg daily but has not been doing so.  Her headache and dizziness are currently improved.   Clinical Impression   Pt received supine in bed, agreeable to OT evaluation. Pt reporting severe HA this am, nsg aware. Pt performing ADLs at baseline functioning. Supervision provided for functional mobility tasks due to migraine and slight dizziness upon standing. No further OT services required at this time as pt is at baseline functioning with ADLs.     Follow Up Recommendations  No OT follow up;Supervision - Intermittent    Equipment Recommendations  Tub/shower seat       Precautions / Restrictions Precautions Precautions: Fall Restrictions Weight Bearing Restrictions: No      Mobility Bed Mobility Overal bed mobility: Modified Independent                Transfers Overall transfer level: Needs assistance Equipment used: Rolling walker (2 wheeled) Transfers: Sit to/from Stand Sit to  Stand: Supervision                  ADL either performed or assessed with clinical judgement   ADL Overall ADL's : Needs assistance/impaired Eating/Feeding: Modified independent;Bed level Eating/Feeding Details (indicate cue type and reason): Pt able to prepare all food-open packages, stir Grooming: Wash/dry hands;Supervision/safety;Standing                   Toilet Transfer: Supervision/safety;Grab bars;RW   Toileting- Clothing Manipulation and Hygiene: Modified independent;Sitting/lateral lean       Functional mobility during ADLs: Supervision/safety;Rolling walker       Vision Baseline Vision/History: Wears glasses Wears Glasses: At all times Patient Visual Report: No change from baseline Vision Assessment?: No apparent visual deficits            Pertinent Vitals/Pain Pain Assessment: 0-10 Pain Score: 10-Worst pain ever Pain Location: headache on top of head and low back pain Pain Descriptors / Indicators: Aching Pain Intervention(s): Limited activity within patient's tolerance;Monitored during session;Repositioned;Heat applied;Other (comment)(RN aware)     Hand Dominance Right   Extremity/Trunk Assessment Upper Extremity Assessment Upper Extremity Assessment: Generalized weakness(grossly 4/5-at baseline)   Lower Extremity Assessment Lower Extremity Assessment: Defer to PT evaluation   Cervical / Trunk Assessment Cervical / Trunk Assessment: Normal   Communication Communication Communication: No difficulties   Cognition Arousal/Alertness: Awake/alert Behavior During Therapy: WFL for tasks assessed/performed Overall Cognitive Status: Within Functional Limits for tasks assessed  Home Living Family/patient expects to be discharged to:: Private residence Living Arrangements: Alone Available Help at Discharge: Family;Available PRN/intermittently Type of Home: Apartment Home  Access: Level entry     Home Layout: One level     Bathroom Shower/Tub: Chief Strategy Officer: Standard     Home Equipment: Cane - single point;Toilet riser      Lives With: Alone    Prior Functioning/Environment Level of Independence: Independent with assistive device(s)        Comments: community ambulator with SPC PRN; independent with ADLs        OT Problem List: Decreased activity tolerance       End of Session Equipment Utilized During Treatment: Rolling walker;Gait belt  Activity Tolerance: Patient tolerated treatment well Patient left: in bed;with call bell/phone within reach;with chair alarm set  OT Visit Diagnosis: Muscle weakness (generalized) (M62.81);Pain Pain - Right/Left: (top) Pain - part of body: (head)                Time: [id number] OT Time Calculation (min): 16 min Charges:  OT General Charges $OT Visit: 1 Visit OT Evaluation $OT Eval Low Complexity: 1 Low   Ezra Sites, OTR/L  [phone] 06/12/2018, 8:46 AM

## 2018-06-12 NOTE — Discharge Summary (Signed)
Physician Discharge Summary  [name] Rojas UJW:[id number] DOB: 07/03/1941 DOA: 06/10/2018  PCP: Benita Stabile, MD  Admit date: 06/10/2018 Discharge date: 06/12/2018  Admitted From: Home Disposition:  Home   Recommendations for Outpatient Follow-up:  1. Follow up with PCP in 1-2 weeks 2. Please obtain BMP/CBC in one week   Home Health: YES Equipment/Devices: HHPT  Discharge Condition: Stable CODE STATUS: FULL Diet recommendation: Heart Healthy   Brief/Interim Summary: 77 year old female with history of fibromyalgia, chronic back pain, hypertension, stroke with residual left hemiparesis, GERD presenting with right frontal headache, dizziness, and gait instability that began when she woke up in the morning of 06/10/2018.  The patient had been also complaining of some subjective chills.  She has some chest discomfort on the evening of 06/10/2018 with some associated shortness of breath.  She states that it was a dull sensation lasting only a few minutes.  There is no exacerbating or alleviating factors.  The patient felt that moving and getting from a supine position made her dizziness worse.  As result, the patient presented emergency department for further evaluation.  Patient denied any nausea, vomiting, diarrhea, abdominal pain, dysuria, hematuria.  The patient denies any falling or syncope.  Apparently, the patient checked her blood pressure at home and it was noted to be elevated.  In the emergency department, the patient was noted to be afebrile hemodynamically stable.  Initial blood pressure was 168/102, 100% on room air.  Potassium was 3.4.  Otherwise BMP, LFTs, and CBC were essentially unremarkable.  Urinalysis was negative.  Urine drug screen positive for opiates.  EKG shows sinus rhythm with nonspecific ST changes.  Patient was admitted for further evaluation of possible stroke.  Neurology was consulted.  They felt that the patient may have had a hypertensive urgency which may have  contributed to the patient's headache and dizziness.  They ordered a dexamethasone IV.  However the patient lost IV access.  As result, this was given po.  The patient's dizziness overall improved.  PT was consulted.  They recommended home health physical therapy which was set up with assistance of care management.    Discharge Diagnoses:  Chronic Daily Headache/vertigo -suspect complicated migraine/analgesic headache -Dix-Hallpike maneuver negative -Check orthostatic vitals -EEG--normal -Neurology Consult--?related to HTN urgency-->dexamethasone x 1 and start butabiltal -PT/OT evaluation-->HHPT -CT brain--neg for acute findings -MRI brain--negative for acute findings -Carotid Duplex--no hemodynamically significant stenosis -Echo--EF 60 to 65%, no WMA, grade 1 DD, mild MR -LDL--80 -HbA1C--5.3 -Antiplatelet--ASA 325 mg  Fibromyalgia/chronic back pain -The [place] Controlled Substance Reporting System has been queried for this patient -no red flags -Continue home dose of hydrocodone -Continue Lexapro  Hyperlipidemia -Continue statin  Essential hypertension -Holding HCTZ to allow for permissive hypertension  Hypokalemia -Replete  Atypical chest pain -Personally reviewed EKG--sinus rhythm, nonspecific ST changes -Cycle troponins--0.03 x 3 -Echocardiogram--EF 60 to 65%, no WMA, grade 1 DD, mild MR     Discharge Instructions   Allergies as of 06/12/2018      Reactions   Codeine Nausea And Vomiting   Iodinated Diagnostic Agents Itching, Nausea Only   Lyrica [pregabalin]    swelling   Statins Nausea Only   Muscle weakness Muscle weakness      Medication List    TAKE these medications   ascorbic acid 500 MG tablet Commonly known as:  VITAMIN C Take 1 tablet by mouth daily.   aspirin EC 81 MG tablet Take 1 tablet (81 mg total) by mouth daily. What changed:  when to take this  reasons to take this   B-12 PO Take 1 tablet by mouth daily.     butalbital-acetaminophen-caffeine 50-325-40 MG tablet Commonly known as:  FIORICET, ESGIC Take 2 tablets by mouth every 4 (four) hours as needed for headache.   cholecalciferol 1000 units tablet Commonly known as:  VITAMIN D Take 1,000 Units by mouth daily.   escitalopram 10 MG tablet Commonly known as:  LEXAPRO Take 1 tablet (10 mg total) by mouth daily. What changed:    medication strength  how much to take   hydrochlorothiazide 25 MG tablet Commonly known as:  HYDRODIURIL Take 1 tablet (25 mg total) by mouth daily.   HYDROcodone-acetaminophen 10-325 MG tablet Commonly known as:  NORCO Take 1 tablet by mouth every 6 (six) hours as needed for moderate pain.   loratadine 10 MG tablet Commonly known as:  CLARITIN Take 10 mg by mouth daily.   naproxen 500 MG tablet Commonly known as:  NAPROSYN Take 1 tablet (500 mg total) by mouth 2 (two) times daily. What changed:    when to take this  reasons to take this   omeprazole 40 MG capsule Commonly known as:  PRILOSEC TAKE 1 CAPSULE BY MOUTH EVERY DAY. What changed:    how much to take  how to take this  when to take this   potassium chloride 10 MEQ tablet Commonly known as:  KLOR-CON M10 Take 1 tablet (10 mEq total) by mouth daily.   tiZANidine 4 MG tablet Commonly known as:  ZANAFLEX Take 1 tablet (4 mg total) by mouth at bedtime.       Allergies  Allergen Reactions  . Codeine Nausea And Vomiting  . Iodinated Diagnostic Agents Itching and Nausea Only  . Lyrica [Pregabalin]     swelling  . Statins Nausea Only    Muscle weakness Muscle weakness     Consultations:  neurology   Procedures/Studies: Dg Chest 2 View  Result Date: 06/10/2018 CLINICAL DATA:  Dizziness and headache.  Shortness of breath. EXAM: CHEST - 2 VIEW COMPARISON:  Chest x-ray dated February 01, 2017. FINDINGS: The heart size and mediastinal contours are within normal limits. Normal pulmonary vascularity. Atherosclerotic  calcification of the aortic arch. No focal consolidation, pleural effusion, or pneumothorax. No acute osseous abnormality. IMPRESSION: 1.  No active cardiopulmonary disease. 2.  Aortic atherosclerosis (ICD10-I70.0). Electronically Signed   By: Obie Dredge M.D.   On: 06/10/2018 19:07   Ct Head Wo Contrast  Result Date: 06/10/2018 CLINICAL DATA:  Headache. EXAM: CT HEAD WITHOUT CONTRAST TECHNIQUE: Contiguous axial images were obtained from the base of the skull through the vertex without intravenous contrast. COMPARISON:  None. FINDINGS: Brain: Mild chronic ischemic white matter disease is noted. No mass effect or midline shift is noted. Ventricular size is within normal limits. There is no evidence of mass lesion, hemorrhage or acute infarction. Vascular: No hyperdense vessel or unexpected calcification. Skull: Normal. Negative for fracture or focal lesion. Sinuses/Orbits: No acute finding. Other: None. IMPRESSION: Mild chronic ischemic white matter disease. No acute intracranial abnormality seen. Electronically Signed   By: Lupita Raider, M.D.   On: 06/10/2018 19:02   Mr Maxine Glenn Head Wo Contrast  Result Date: 06/12/2018 CLINICAL DATA:  Dizziness over the last 2 days. Chronic headache. Chronic small-vessel change seen at parenchymal imaging. EXAM: MRA HEAD WITHOUT CONTRAST TECHNIQUE: Angiographic images of the Circle of Willis were obtained using MRA technique without intravenous contrast. COMPARISON:  MR head 06/11/2018 FINDINGS: The  study suffers from significant motion degradation at the circle of Willis level. Both internal carotid arteries are patent through the skull base and siphon regions. In looking at the source imaging, think the anterior and middle cerebral vessels are patent and normal, though detail is limited by the motion artifact. Prominent posterior communicating arteries are present. Both vertebral arteries are patent through the foramen magnum to the basilar. No basilar stenosis is  suspected. Posterior circulation branch vessels appear patent. IMPRESSION: Significant motion degradation. However, in evaluating the source images, I do not see any significant large or medium vessel finding. See above. Electronically Signed   By: Paulina Fusi M.D.   On: 06/12/2018 11:51   Mr Brain Wo Contrast  Result Date: 06/11/2018 CLINICAL DATA:  Chronic headache with normal neuro exam EXAM: MRI HEAD WITHOUT CONTRAST TECHNIQUE: Multiplanar, multiecho pulse sequences of the brain and surrounding structures were obtained without intravenous contrast. COMPARISON:  06/10/2018 FINDINGS: Brain: No acute infarction, hemorrhage, hydrocephalus, extra-axial collection or mass lesion. Patchy FLAIR hyperintensity in the cerebral white matter and deep gray nuclei attributed to chronic small vessel ischemia, overall mild for age. Dilated perivascular spaces accentuate T2 hyperintensity within the basal ganglia. Normal brain volume. Vascular: Major flow voids are preserved Skull and upper cervical spine: No evidence of marrow lesion. Sinuses/Orbits: Partial right mastoid opacification with negative nasopharynx. Based on prior head CT this partial opacification is favored chronic. IMPRESSION: 1. No acute finding. 2. Mild for age chronic small vessel ischemia. 3. Partial right mastoid opacification with negative nasopharynx, likely chronic. Electronically Signed   By: Marnee Spring M.D.   On: 06/11/2018 09:08   [place] Carotid Bilateral (at [facility] Only)  Result Date: 06/11/2018 CLINICAL DATA:  TIA, headache and dizziness.  Hypertension. EXAM: BILATERAL CAROTID DUPLEX ULTRASOUND TECHNIQUE: Wallace Cullens scale imaging, color Doppler and duplex ultrasound was performed of bilateral carotid and vertebral arteries in the neck. COMPARISON:  None. TECHNIQUE: Quantification of carotid stenosis is based on velocity parameters that correlate the residual internal carotid diameter with NASCET-based stenosis levels, using the diameter of  the distal internal carotid lumen as the denominator for stenosis measurement. The following velocity measurements were obtained: PEAK SYSTOLIC/END DIASTOLIC RIGHT ICA:                     124/42cm/sec CCA:                     90/20cm/sec SYSTOLIC ICA/CCA RATIO:  1.4 ECA:                     87cm/sec LEFT ICA:                     103/39cm/sec CCA:                     87/27cm/sec SYSTOLIC ICA/CCA RATIO:  1.2 ECA:                     68cm/sec FINDINGS: RIGHT CAROTID ARTERY: Tortuous system. Eccentric partially calcified plaque in the bulb and proximal ICA. No high-grade stenosis. Normal waveforms and color Doppler signal. RIGHT VERTEBRAL ARTERY:  Normal flow direction and waveform. LEFT CAROTID ARTERY: Mildly tortuous. Eccentric partially calcified plaque in the bulb extending into proximal internal and external carotid arteries. No high-grade stenosis. Normal waveforms and color Doppler signal. LEFT VERTEBRAL ARTERY: Normal flow direction and waveform. IMPRESSION: 1. Bilateral carotid bifurcation and proximal ICA plaque  resulting in less than 50% diameter stenosis. 2.  Antegrade bilateral vertebral arterial flow. Electronically Signed   By: Corlis Leak M.D.   On: 06/11/2018 10:36         Discharge Exam: Vitals:   06/12/18 0945 06/12/18 1345  BP: 131/77 (!) 145/119  Pulse: 73 81  Resp: 18 18  Temp: 98.1 F (36.7 C)   SpO2: 97% 100%   Vitals:   06/12/18 0145 06/12/18 0543 06/12/18 0945 06/12/18 1345  BP: 129/84 137/78 131/77 (!) 145/119  Pulse: 68 64 73 81  Resp:   18 18  Temp: 98.2 F (36.8 C) 98.2 F (36.8 C) 98.1 F (36.7 C)   TempSrc: Oral Oral Oral   SpO2: 100% 98% 97% 100%  Weight:      Height:        General: Pt is alert, awake, not in acute distress Cardiovascular: RRR, S1/S2 +, no rubs, no gallops Respiratory: CTA bilaterally, no wheezing, no rhonchi Abdominal: Soft, NT, ND, bowel sounds + Extremities: no edema, no cyanosis   The results of significant diagnostics from  this hospitalization (including imaging, microbiology, ancillary and laboratory) are listed below for reference.    Significant Diagnostic Studies: Dg Chest 2 View  Result Date: 06/10/2018 CLINICAL DATA:  Dizziness and headache.  Shortness of breath. EXAM: CHEST - 2 VIEW COMPARISON:  Chest x-ray dated February 01, 2017. FINDINGS: The heart size and mediastinal contours are within normal limits. Normal pulmonary vascularity. Atherosclerotic calcification of the aortic arch. No focal consolidation, pleural effusion, or pneumothorax. No acute osseous abnormality. IMPRESSION: 1.  No active cardiopulmonary disease. 2.  Aortic atherosclerosis (ICD10-I70.0). Electronically Signed   By: Obie Dredge M.D.   On: 06/10/2018 19:07   Ct Head Wo Contrast  Result Date: 06/10/2018 CLINICAL DATA:  Headache. EXAM: CT HEAD WITHOUT CONTRAST TECHNIQUE: Contiguous axial images were obtained from the base of the skull through the vertex without intravenous contrast. COMPARISON:  None. FINDINGS: Brain: Mild chronic ischemic white matter disease is noted. No mass effect or midline shift is noted. Ventricular size is within normal limits. There is no evidence of mass lesion, hemorrhage or acute infarction. Vascular: No hyperdense vessel or unexpected calcification. Skull: Normal. Negative for fracture or focal lesion. Sinuses/Orbits: No acute finding. Other: None. IMPRESSION: Mild chronic ischemic white matter disease. No acute intracranial abnormality seen. Electronically Signed   By: Lupita Raider, M.D.   On: 06/10/2018 19:02   Mr Maxine Glenn Head Wo Contrast  Result Date: 06/12/2018 CLINICAL DATA:  Dizziness over the last 2 days. Chronic headache. Chronic small-vessel change seen at parenchymal imaging. EXAM: MRA HEAD WITHOUT CONTRAST TECHNIQUE: Angiographic images of the Circle of Willis were obtained using MRA technique without intravenous contrast. COMPARISON:  MR head 06/11/2018 FINDINGS: The study suffers from significant  motion degradation at the circle of Willis level. Both internal carotid arteries are patent through the skull base and siphon regions. In looking at the source imaging, think the anterior and middle cerebral vessels are patent and normal, though detail is limited by the motion artifact. Prominent posterior communicating arteries are present. Both vertebral arteries are patent through the foramen magnum to the basilar. No basilar stenosis is suspected. Posterior circulation branch vessels appear patent. IMPRESSION: Significant motion degradation. However, in evaluating the source images, I do not see any significant large or medium vessel finding. See above. Electronically Signed   By: Paulina Fusi M.D.   On: 06/12/2018 11:51   Mr Brain Wo Contrast  Result  Date: 06/11/2018 CLINICAL DATA:  Chronic headache with normal neuro exam EXAM: MRI HEAD WITHOUT CONTRAST TECHNIQUE: Multiplanar, multiecho pulse sequences of the brain and surrounding structures were obtained without intravenous contrast. COMPARISON:  06/10/2018 FINDINGS: Brain: No acute infarction, hemorrhage, hydrocephalus, extra-axial collection or mass lesion. Patchy FLAIR hyperintensity in the cerebral white matter and deep gray nuclei attributed to chronic small vessel ischemia, overall mild for age. Dilated perivascular spaces accentuate T2 hyperintensity within the basal ganglia. Normal brain volume. Vascular: Major flow voids are preserved Skull and upper cervical spine: No evidence of marrow lesion. Sinuses/Orbits: Partial right mastoid opacification with negative nasopharynx. Based on prior head CT this partial opacification is favored chronic. IMPRESSION: 1. No acute finding. 2. Mild for age chronic small vessel ischemia. 3. Partial right mastoid opacification with negative nasopharynx, likely chronic. Electronically Signed   By: Marnee SpringJonathon  Watts M.D.   On: 06/11/2018 09:08   [place] Carotid Bilateral (at [facility] Only)  Result Date:  06/11/2018 CLINICAL DATA:  TIA, headache and dizziness.  Hypertension. EXAM: BILATERAL CAROTID DUPLEX ULTRASOUND TECHNIQUE: Wallace CullensGray scale imaging, color Doppler and duplex ultrasound was performed of bilateral carotid and vertebral arteries in the neck. COMPARISON:  None. TECHNIQUE: Quantification of carotid stenosis is based on velocity parameters that correlate the residual internal carotid diameter with NASCET-based stenosis levels, using the diameter of the distal internal carotid lumen as the denominator for stenosis measurement. The following velocity measurements were obtained: PEAK SYSTOLIC/END DIASTOLIC RIGHT ICA:                     124/42cm/sec CCA:                     90/20cm/sec SYSTOLIC ICA/CCA RATIO:  1.4 ECA:                     87cm/sec LEFT ICA:                     103/39cm/sec CCA:                     87/27cm/sec SYSTOLIC ICA/CCA RATIO:  1.2 ECA:                     68cm/sec FINDINGS: RIGHT CAROTID ARTERY: Tortuous system. Eccentric partially calcified plaque in the bulb and proximal ICA. No high-grade stenosis. Normal waveforms and color Doppler signal. RIGHT VERTEBRAL ARTERY:  Normal flow direction and waveform. LEFT CAROTID ARTERY: Mildly tortuous. Eccentric partially calcified plaque in the bulb extending into proximal internal and external carotid arteries. No high-grade stenosis. Normal waveforms and color Doppler signal. LEFT VERTEBRAL ARTERY: Normal flow direction and waveform. IMPRESSION: 1. Bilateral carotid bifurcation and proximal ICA plaque resulting in less than 50% diameter stenosis. 2.  Antegrade bilateral vertebral arterial flow. Electronically Signed   By: Corlis Leak  Hassell M.D.   On: 06/11/2018 10:36     Microbiology: No results found for this or any previous visit (from the past 240 hour(s)).   Labs: Basic Metabolic Panel: Recent Labs  Lab 06/10/18 1941 06/11/18 0632 06/12/18 0511  NA 139 143 144  K 3.4* 4.3 3.8  CL 104 106 107  CO2 27 31 31   GLUCOSE 96 108* 106*  BUN  19 17 14   CREATININE 0.63 0.69 0.78  CALCIUM 9.4 9.3 9.0   Liver Function Tests: Recent Labs  Lab 06/10/18 1941  AST 22  ALT 23  ALKPHOS 41  BILITOT 0.8  PROT 6.4*  ALBUMIN 4.0   No results for input(s): LIPASE, AMYLASE in the last 168 hours. No results for input(s): AMMONIA in the last 168 hours. CBC: Recent Labs  Lab 06/10/18 1941 06/11/18 0632 06/12/18 0511  WBC 4.8 3.6* 3.7*  NEUTROABS 2.5  --   --   HGB 11.3* 10.9* 10.9*  HCT 34.8* 34.2* 33.7*  MCV 103.9* 105.2* 106.0*  PLT 201 185 183   Cardiac Enzymes: Recent Labs  Lab 06/11/18 0854 06/11/18 1500 06/11/18 2001  TROPONINI 0.03* 0.03* 0.03*   BNP: Invalid input(s): POCBNP CBG: No results for input(s): GLUCAP in the last 168 hours.  Time coordinating discharge:  36 minutes  Signed:  Catarina Hartshorn, DO [facility] Pager: [phone] 06/12/2018, 6:29 PM

## 2018-06-13 LAB — HOMOCYSTEINE: Homocysteine: <3 umol/L (ref 0.0–15.0)

## 2018-06-13 LAB — RPR: RPR: NONREACTIVE

## 2018-06-19 DIAGNOSIS — I10 Essential (primary) hypertension: Secondary | ICD-10-CM | POA: Diagnosis not present

## 2018-06-19 DIAGNOSIS — D649 Anemia, unspecified: Secondary | ICD-10-CM | POA: Diagnosis not present

## 2018-06-19 DIAGNOSIS — K219 Gastro-esophageal reflux disease without esophagitis: Secondary | ICD-10-CM | POA: Diagnosis not present

## 2018-06-19 DIAGNOSIS — R51 Headache: Secondary | ICD-10-CM | POA: Diagnosis not present

## 2018-06-19 DIAGNOSIS — M1991 Primary osteoarthritis, unspecified site: Secondary | ICD-10-CM | POA: Diagnosis not present

## 2018-06-19 DIAGNOSIS — G8929 Other chronic pain: Secondary | ICD-10-CM | POA: Diagnosis not present

## 2018-06-19 DIAGNOSIS — R42 Dizziness and giddiness: Secondary | ICD-10-CM | POA: Diagnosis not present

## 2018-06-19 DIAGNOSIS — M549 Dorsalgia, unspecified: Secondary | ICD-10-CM | POA: Diagnosis not present

## 2018-06-19 DIAGNOSIS — I69354 Hemiplegia and hemiparesis following cerebral infarction affecting left non-dominant side: Secondary | ICD-10-CM | POA: Diagnosis not present

## 2018-06-19 DIAGNOSIS — E785 Hyperlipidemia, unspecified: Secondary | ICD-10-CM | POA: Diagnosis not present

## 2018-07-02 ENCOUNTER — Other Ambulatory Visit: Payer: Self-pay | Admitting: Family Medicine

## 2018-07-03 DIAGNOSIS — M79671 Pain in right foot: Secondary | ICD-10-CM | POA: Diagnosis not present

## 2018-07-03 DIAGNOSIS — M2012 Hallux valgus (acquired), left foot: Secondary | ICD-10-CM | POA: Diagnosis not present

## 2018-07-03 DIAGNOSIS — I739 Peripheral vascular disease, unspecified: Secondary | ICD-10-CM | POA: Diagnosis not present

## 2018-07-03 DIAGNOSIS — M2011 Hallux valgus (acquired), right foot: Secondary | ICD-10-CM | POA: Diagnosis not present

## 2018-07-03 NOTE — Addendum Note (Signed)
Encounter addended by: Weyman RodneyZwelling, Amy M on: 07/03/2018 2:23 PM  Actions taken: Charge Capture section accepted

## 2018-07-05 ENCOUNTER — Other Ambulatory Visit: Payer: Self-pay | Admitting: Family Medicine

## 2018-07-05 DIAGNOSIS — M545 Low back pain: Secondary | ICD-10-CM | POA: Diagnosis not present

## 2018-07-05 DIAGNOSIS — Z79891 Long term (current) use of opiate analgesic: Secondary | ICD-10-CM | POA: Diagnosis not present

## 2018-07-05 DIAGNOSIS — M797 Fibromyalgia: Secondary | ICD-10-CM | POA: Diagnosis not present

## 2018-07-05 DIAGNOSIS — M130 Polyarthritis, unspecified: Secondary | ICD-10-CM | POA: Diagnosis not present

## 2018-07-16 NOTE — Progress Notes (Signed)
BH MD/PA/NP OP Progress Note  07/24/2018 9:39 AM [name] Rojas  MRN:  [id number]  Chief Complaint:  Chief Complaint    Depression; Follow-up     HPI:  - Since the last visit, the patient was admitted for dizziness/thought to be due to hypertensive urgency.  Patient presents for follow-up appointment for depression.  She states that she has been feeling "nervous" at times. She wonders whether it comes that she has not "get over the past." She sometimes wants to scream and yell. She talks about her children, who she thinks that "They wanted me to die instead of him (her husband.)" She reports good relationship with her son, who is very helpful for the patient. She goes to senior center regularly. She talks about some medication, which makes her feel drowsy, although she is unable to recollect. She is advised to talk with her provider. She also asks some medication to be prescribed for anxiety prn. She understands to stay on lexapro only to avoid polypharmacy. She denies insomnia. She denies anhedonia. She feels depressed at times. She has fair energy. She denies SI. She denies panic attacks.    Visit Diagnosis:    ICD-10-CM   1. Major depressive disorder, recurrent episode, mild with anxious distress (HCC) F33.0   2. Mild neurocognitive disorder G31.84     Past Psychiatric History: Please see initial evaluation for full details. I have reviewed the history. No updates at this time.    Past Medical History:  Past Medical History:  Diagnosis Date  . Anemia   . Anxiety   . Arthritis   . Chronic back pain   . Depression   . Fibromyalgia   . GERD (gastroesophageal reflux disease)   . Hypertension   . Osteoporosis   . Stroke [facility](HCC)     Past Surgical History:  Procedure Laterality Date  . ABDOMINAL HYSTERECTOMY     bleeding  . BACK SURGERY    . GASTRIC BYPASS    . KNEE SURGERY    . TONSILLECTOMY      Family Psychiatric History: Please see initial evaluation for full details. I have  reviewed the history. No updates at this time.     Family History:  Family History  Problem Relation Age of Onset  . Arthritis Mother   . Asthma Mother   . Heart disease Mother   . Early death Father        trauma  . Heart disease Sister   . Stroke Sister     Social History:  Social History   Socioeconomic History  . Marital status: Divorced    Spouse name: Not on file  . Number of children: 7  . Years of education: Not on file  . Highest education level: GED or equivalent  Occupational History  . Occupation: retired  Engineer, productionocial Needs  . Financial resource strain: Not very hard  . Food insecurity:    Worry: Never true    Inability: Never true  . Transportation needs:    Medical: No    Non-medical: No  Tobacco Use  . Smoking status: Never Smoker  . Smokeless tobacco: Never Used  Substance and Sexual Activity  . Alcohol use: No  . Drug use: No  . Sexual activity: Not Currently  Lifestyle  . Physical activity:    Days per week: 1 day    Minutes per session: 30 min  . Stress: Not at all  Relationships  . Social connections:    Talks on phone: More  than three times a week    Gets together: Twice a week    Attends religious service: More than 4 times per year    Active member of club or organization: No    Attends meetings of clubs or organizations: Never    Relationship status: Divorced  Other Topics Concern  . Not on file  Social History Narrative  . Not on file    Allergies:  Allergies  Allergen Reactions  . Codeine Nausea And Vomiting  . Iodinated Diagnostic Agents Itching and Nausea Only  . Lyrica [Pregabalin]     swelling  . Statins Nausea Only    Muscle weakness Muscle weakness     Metabolic Disorder Labs: Lab Results  Component Value Date   HGBA1C 5.3 06/11/2018   MPG 105.41 06/11/2018   No results found for: PROLACTIN Lab Results  Component Value Date   CHOL 136 06/11/2018   TRIG 49 06/11/2018   HDL 46 06/11/2018   CHOLHDL 3.0  06/11/2018   VLDL 10 06/11/2018   LDLCALC 80 06/11/2018   LDLCALC 70 04/24/2017   Lab Results  Component Value Date   TSH 0.532 06/11/2018   TSH 0.67 04/25/2017    Therapeutic Level Labs: No results found for: LITHIUM No results found for: VALPROATE No components found for:  CBMZ  Current Medications: Current Outpatient Medications  Medication Sig Dispense Refill  . ascorbic acid (VITAMIN C) 500 MG tablet Take 1 tablet by mouth daily.    Marland Kitchen aspirin EC 81 MG tablet Take 1 tablet (81 mg total) by mouth daily. (Patient taking differently: Take 81 mg by mouth as needed. ) 90 tablet 3  . butalbital-acetaminophen-caffeine (FIORICET, ESGIC) 50-325-40 MG tablet Take 2 tablets by mouth every 4 (four) hours as needed for headache. 14 tablet 0  . cholecalciferol (VITAMIN D) 1000 units tablet Take 1,000 Units by mouth daily.    . Cyanocobalamin (B-12 PO) Take 1 tablet by mouth daily.    Marland Kitchen escitalopram (LEXAPRO) 10 MG tablet Take 1 tablet (10 mg total) by mouth daily. 30 tablet 0  . hydrochlorothiazide (HYDRODIURIL) 25 MG tablet Take 1 tablet (25 mg total) by mouth daily. 90 tablet 3  . HYDROcodone-acetaminophen (NORCO) 10-325 MG tablet Take 1 tablet by mouth every 6 (six) hours as needed for moderate pain.     Marland Kitchen loratadine (CLARITIN) 10 MG tablet Take 10 mg by mouth daily.    . naproxen (NAPROSYN) 500 MG tablet Take 1 tablet (500 mg total) by mouth 2 (two) times daily. (Patient taking differently: Take 500 mg by mouth daily as needed for moderate pain. ) 30 tablet 0  . omeprazole (PRILOSEC) 40 MG capsule TAKE 1 CAPSULE BY MOUTH EVERY DAY. (Patient taking differently: TAKE 1 CAPSULE BY MOUTH EVERY DAY AS NEEDED FOR ACID REFLUX) 90 capsule 3  . potassium chloride (KLOR-CON M10) 10 MEQ tablet Take 1 tablet (10 mEq total) by mouth daily. 90 tablet 3  . tiZANidine (ZANAFLEX) 4 MG tablet Take 1 tablet (4 mg total) by mouth at bedtime. 30 tablet 11   No current facility-administered medications for this  visit.      Musculoskeletal: Strength & Muscle Tone: within normal limits Gait & Station: normal Patient leans: N/A  Psychiatric Specialty Exam: Review of Systems  Psychiatric/Behavioral: Positive for depression and memory loss. Negative for hallucinations, substance abuse and suicidal ideas. The patient is nervous/anxious. The patient does not have insomnia.   All other systems reviewed and are negative.   Blood pressure Marland Kitchen)  160/90, pulse 68, height 5\' 4"  (1.626 m), weight 176 lb (79.8 kg), SpO2 96 %.Body mass index is 30.21 kg/m.  General Appearance: Fairly Groomed  Eye Contact:  Good  Speech:  Clear and Coherent  Volume:  Normal  Mood:  Depressed  Affect:  Appropriate, Congruent and down at times, but more reactive, smiles  Thought Process:  Coherent  Orientation:  Full (Time, Place, and Person)  Thought Content: Logical   Suicidal Thoughts:  No  Homicidal Thoughts:  No  Memory:  Immediate;   Good  Judgement:  Fair  Insight:  Shallow  Psychomotor Activity:  Normal  Concentration:  Concentration: Good and Attention Span: Good  Recall:  Good  Fund of Knowledge: Good  Language: Good  Akathisia:  No  Handed:  Right  AIMS (if indicated): not done  Assets:  Communication Skills Desire for Improvement  ADL's:  Intact  Cognition: WNL  Sleep:  Good   Screenings: PHQ2-9     Office Visit from 12/25/2017 in [facility] Clinical Support from 12/04/2017 in [facility] Office Visit from 11/13/2017 in [facility] Office Visit from 06/27/2017 in [facility] Office Visit from 03/30/2017 in [facility]  PHQ-2 Total Score  4  0  0  6  0  PHQ-9 Total Score  9  -  -  14  -     MOCA 20/30 on 07/12/2017 (-4 for visuospatial/executive, -1 for naming, -5 for delayed recall)  Assessment and Plan:  [name] Rojas is a 77 y.o. year old female with a history of depression, anxiety,fibromyalgia,hypertension, pulmonary hypertension, h/o  stroke, GERD, s/p bariatric surgery , who presents for follow up appointment for Major depressive disorder, recurrent episode, mild with anxious distress (HCC)  Mild neurocognitive disorder  # MDD, moderate, recurrent without psychotic features # Unspecified anxiety disorder Patient reports overall good mood since the last appointment.  Will continue Lexapro to target depression; noted that the dose was lowered after recent discharge; will continue current dose to avoid any side effect.  Discussed  behavioral activation. Discussed with the patient that benzodiazepine is not recommended given its potential adverse reaction of confusion especially given her history of cognitive deficits; she is advised to follow up with therapist to work on coping skills.    # Neurocognitive deficits She complains of memory loss and she does have cognitive deficits as catheterized the Moca. ADL/IADL independent. Labs with no treatable cause of dementia. Will continue to monitor.   Plan I have reviewed and updated plans as below 1.Continue lexapro 10 mg daily (QTc 411 msec 06/2017) 2. Return to clinic in three months for 15 mins - Sheis advisedto see Mr. Sheets for therapy  Neysa Hotter, MD 07/24/2018, 9:39 AM

## 2018-07-20 ENCOUNTER — Other Ambulatory Visit: Payer: Self-pay | Admitting: Family Medicine

## 2018-07-24 ENCOUNTER — Ambulatory Visit (INDEPENDENT_AMBULATORY_CARE_PROVIDER_SITE_OTHER): Payer: Medicare Other | Admitting: Psychiatry

## 2018-07-24 VITALS — BP 160/90 | HR 68 | Ht 64.0 in | Wt 176.0 lb

## 2018-07-24 DIAGNOSIS — F330 Major depressive disorder, recurrent, mild: Secondary | ICD-10-CM

## 2018-07-24 DIAGNOSIS — G3184 Mild cognitive impairment, so stated: Secondary | ICD-10-CM

## 2018-07-24 MED ORDER — ESCITALOPRAM OXALATE 10 MG PO TABS: 10.0000 mg | ORAL_TABLET | Freq: Every day | ORAL | 0 refills | Status: DC

## 2018-07-24 NOTE — Patient Instructions (Signed)
1.Continue lexapro 10 mg daily (QTc 411 msec 06/2017) 2. Return to clinic in three months for 15 mins

## 2018-08-01 DIAGNOSIS — I10 Essential (primary) hypertension: Secondary | ICD-10-CM | POA: Diagnosis not present

## 2018-08-01 DIAGNOSIS — M545 Low back pain: Secondary | ICD-10-CM | POA: Diagnosis not present

## 2018-08-01 DIAGNOSIS — M1990 Unspecified osteoarthritis, unspecified site: Secondary | ICD-10-CM | POA: Diagnosis not present

## 2018-08-01 DIAGNOSIS — G9009 Other idiopathic peripheral autonomic neuropathy: Secondary | ICD-10-CM | POA: Diagnosis not present

## 2018-08-02 DIAGNOSIS — I10 Essential (primary) hypertension: Secondary | ICD-10-CM | POA: Diagnosis not present

## 2018-08-02 DIAGNOSIS — M1990 Unspecified osteoarthritis, unspecified site: Secondary | ICD-10-CM | POA: Diagnosis not present

## 2018-08-02 DIAGNOSIS — G9009 Other idiopathic peripheral autonomic neuropathy: Secondary | ICD-10-CM | POA: Diagnosis not present

## 2018-08-02 DIAGNOSIS — M545 Low back pain: Secondary | ICD-10-CM | POA: Diagnosis not present

## 2018-09-03 DIAGNOSIS — G3184 Mild cognitive impairment, so stated: Secondary | ICD-10-CM | POA: Diagnosis not present

## 2018-09-03 DIAGNOSIS — E871 Hypo-osmolality and hyponatremia: Secondary | ICD-10-CM | POA: Diagnosis not present

## 2018-09-03 DIAGNOSIS — N390 Urinary tract infection, site not specified: Secondary | ICD-10-CM | POA: Diagnosis not present

## 2018-09-03 DIAGNOSIS — M130 Polyarthritis, unspecified: Secondary | ICD-10-CM | POA: Diagnosis not present

## 2018-09-03 DIAGNOSIS — M545 Low back pain: Secondary | ICD-10-CM | POA: Diagnosis not present

## 2018-09-03 DIAGNOSIS — R109 Unspecified abdominal pain: Secondary | ICD-10-CM | POA: Diagnosis not present

## 2018-09-03 DIAGNOSIS — N3090 Cystitis, unspecified without hematuria: Secondary | ICD-10-CM | POA: Diagnosis not present

## 2018-09-03 DIAGNOSIS — Z7689 Persons encountering health services in other specified circumstances: Secondary | ICD-10-CM | POA: Diagnosis not present

## 2018-09-03 DIAGNOSIS — Z79891 Long term (current) use of opiate analgesic: Secondary | ICD-10-CM | POA: Diagnosis not present

## 2018-09-03 DIAGNOSIS — M797 Fibromyalgia: Secondary | ICD-10-CM | POA: Diagnosis not present

## 2018-09-03 DIAGNOSIS — I10 Essential (primary) hypertension: Secondary | ICD-10-CM | POA: Diagnosis not present

## 2018-09-03 DIAGNOSIS — G9009 Other idiopathic peripheral autonomic neuropathy: Secondary | ICD-10-CM | POA: Diagnosis not present

## 2018-09-18 ENCOUNTER — Ambulatory Visit (INDEPENDENT_AMBULATORY_CARE_PROVIDER_SITE_OTHER): Payer: Medicare Other | Admitting: Licensed Clinical Social Worker

## 2018-09-18 ENCOUNTER — Encounter (HOSPITAL_COMMUNITY): Payer: Self-pay | Admitting: Licensed Clinical Social Worker

## 2018-09-18 DIAGNOSIS — F330 Major depressive disorder, recurrent, mild: Secondary | ICD-10-CM

## 2018-09-18 NOTE — Progress Notes (Signed)
   THERAPIST PROGRESS NOTE  Session Time: 9:00 am-9:45 am  Participation Level: Active  Behavioral Response: CasualAlertEuthymic  Type of Therapy: Individual Therapy  Treatment Goals addressed: Coping  Interventions: CBT and Solution Focused  Summary: [name] Rojas is a 77 y.o. female who presents  oriented x5 (person, place, situation, time and object), alert with periods of tearfulness, well groomed, euthymic, and cooperative to address mood and anxiety. Patient has a history of medical treatment including fibromyalgia, back surgeries, osteoporosis and back surgery. Patient has limited mental health treatment including medication management. Patient denies symptoms of mania. Patient denies suicidal and homicidal ideations. She denies symptoms of psychosis including auditory and visual hallucinations. Patient denies current or history of substance abuse. Patient is at no risk for lethality. Patient has a history of unhealthy relationships that include her family of origin, her former marriage and her relationship with two of her children. Patient has a history of feeling used and rejected in those relationships.  Physically: Patient is experiencing chronic pain. She has back pain that makes it difficult to function at times.  Spiritually: Patient is spiritually healthy. She prays, attends church and lives her beliefs.  Relationships: Patient is getting along with her family. She is communicating with them. She is open to companionship. She is tired of thinking every man is like her ex husband. Patient was getting to know a man that made an inappropriate comment towards her and she ended the friendship.  Emotionally/Mentally/Behavior: Patient has moments of depression due to her chronic pain and not having companionship. She is serving in church and attending the senior center which makes her happy.   Patient engaged in session. She responded well to interventions. Patient continues to meet  criteria for Major depressive disorder, recurrent episode, mild with anxious distress. Patient will continue in individual outpatient therapy due to being the least restrictive service to meet her needs. Patient made moderate progress on her goals.   Suicidal/Homicidal: Negativewithout intent/plan  Therapist Response: Therapist reviewed patient's recent thoughts and behaviors. Therapist utilized CBT to address mood. Therapist processed patient's feelings to identify triggers for mood. Therapist discussed patient focusing on the future rather than dwelling on the past (her ex husband) and living her life.   Plan: Return again in 8 weeks.             Diagnosis: Axis I: ADHD, inattentive type and Major depressive disorder, recurrent episode, mild with anxious distress    Axis II: No diagnosis    Bynum Bellows, LCSW 09/18/2018

## 2018-10-02 DIAGNOSIS — M79672 Pain in left foot: Secondary | ICD-10-CM | POA: Diagnosis not present

## 2018-10-02 DIAGNOSIS — M79671 Pain in right foot: Secondary | ICD-10-CM | POA: Diagnosis not present

## 2018-10-02 DIAGNOSIS — I739 Peripheral vascular disease, unspecified: Secondary | ICD-10-CM | POA: Diagnosis not present

## 2018-10-02 DIAGNOSIS — M25579 Pain in unspecified ankle and joints of unspecified foot: Secondary | ICD-10-CM | POA: Diagnosis not present

## 2018-10-09 ENCOUNTER — Telehealth (HOSPITAL_COMMUNITY): Payer: Self-pay | Admitting: *Deleted

## 2018-10-09 NOTE — Telephone Encounter (Signed)
Patient called stating that she needed a refill on her Lexapro.  When I told patient that Dr Vanetta Shawl  E- scribed : escitalopram (LEXAPRO) 10 MG tablet 90 tablet 0 07/24/2018    Sig - Route: Take 1 tablet (10 mg total) by mouth daily. - Oral    And that 90 tablets taken 1 daily is a  3 month supply that should last until her 10/24/18 appointment. She stated okay & hung up

## 2018-10-10 ENCOUNTER — Other Ambulatory Visit (HOSPITAL_COMMUNITY): Payer: Self-pay | Admitting: Psychiatry

## 2018-10-10 MED ORDER — ESCITALOPRAM OXALATE 10 MG PO TABS: 10.0000 mg | ORAL_TABLET | Freq: Every day | ORAL | 0 refills | Status: AC

## 2018-10-22 NOTE — Progress Notes (Signed)
BH MD/PA/NP OP Progress Note  10/24/2018 9:20 AM [name] Rojas  MRN:  [id number]  Chief Complaint:  Chief Complaint    Follow-up; Depression     HPI:  Patient presents for follow-up appointment for depression.  She states that she was told by her primary care to try Lexapro 25 mg.  It caused her nausea and she did not like it.  She takes Lexapro twice a day at times.  It is discussed with the patient regarding the importance of adherence to medication and potential risk of taking higher dose of lexapro.  She states that the things with her family is going well.  She is on her way to go to senior center.  She has fair sleep.  She denies feeling depressed.  She has good motivation.  She has fair concentration.  She denies SI.  She feels irritable at times.  She feels anxious and tense at times.  She denies panic attacks.   Visit Diagnosis:    ICD-10-CM   1. Major depressive disorder, recurrent episode, mild with anxious distress (HCC) F33.0     Past Psychiatric History: Please see initial evaluation for full details. I have reviewed the history. No updates at this time.     Past Medical History:  Past Medical History:  Diagnosis Date  . Anemia   . Anxiety   . Arthritis   . Chronic back pain   . Depression   . Fibromyalgia   . GERD (gastroesophageal reflux disease)   . Hypertension   . Osteoporosis   . Stroke [facility])     Past Surgical History:  Procedure Laterality Date  . ABDOMINAL HYSTERECTOMY     bleeding  . BACK SURGERY    . GASTRIC BYPASS    . KNEE SURGERY    . TONSILLECTOMY      Family Psychiatric History: Please see initial evaluation for full details. I have reviewed the history. No updates at this time.     Family History:  Family History  Problem Relation Age of Onset  . Arthritis Mother   . Asthma Mother   . Heart disease Mother   . Early death Father        trauma  . Heart disease Sister   . Stroke Sister     Social History:  Social History    Socioeconomic History  . Marital status: Divorced    Spouse name: Not on file  . Number of children: 7  . Years of education: Not on file  . Highest education level: GED or equivalent  Occupational History  . Occupation: retired  Engineer, production  . Financial resource strain: Not very hard  . Food insecurity:    Worry: Never true    Inability: Never true  . Transportation needs:    Medical: No    Non-medical: No  Tobacco Use  . Smoking status: Never Smoker  . Smokeless tobacco: Never Used  Substance and Sexual Activity  . Alcohol use: No  . Drug use: No  . Sexual activity: Not Currently  Lifestyle  . Physical activity:    Days per week: 1 day    Minutes per session: 30 min  . Stress: Not at all  Relationships  . Social connections:    Talks on phone: More than three times a week    Gets together: Twice a week    Attends religious service: More than 4 times per year    Active member of club or organization: No  Attends meetings of clubs or organizations: Never    Relationship status: Divorced  Other Topics Concern  . Not on file  Social History Narrative  . Not on file    Allergies:  Allergies  Allergen Reactions  . Codeine Nausea And Vomiting  . Iodinated Diagnostic Agents Itching and Nausea Only  . Lyrica [Pregabalin]     swelling  . Statins Nausea Only    Muscle weakness Muscle weakness     Metabolic Disorder Labs: Lab Results  Component Value Date   HGBA1C 5.3 06/11/2018   MPG 105.41 06/11/2018   No results found for: PROLACTIN Lab Results  Component Value Date   CHOL 136 06/11/2018   TRIG 49 06/11/2018   HDL 46 06/11/2018   CHOLHDL 3.0 06/11/2018   VLDL 10 06/11/2018   LDLCALC 80 06/11/2018   LDLCALC 70 04/24/2017   Lab Results  Component Value Date   TSH 0.532 06/11/2018   TSH 0.67 04/25/2017    Therapeutic Level Labs: No results found for: LITHIUM No results found for: VALPROATE No components found for:  CBMZ  Current  Medications: Current Outpatient Medications  Medication Sig Dispense Refill  . ascorbic acid (VITAMIN C) 500 MG tablet Take 1 tablet by mouth daily.    Marland Kitchen aspirin EC 81 MG tablet Take 1 tablet (81 mg total) by mouth daily. (Patient taking differently: Take 81 mg by mouth as needed. ) 90 tablet 3  . butalbital-acetaminophen-caffeine (FIORICET, ESGIC) 50-325-40 MG tablet Take 2 tablets by mouth every 4 (four) hours as needed for headache. 14 tablet 0  . cholecalciferol (VITAMIN D) 1000 units tablet Take 1,000 Units by mouth daily.    . Cyanocobalamin (B-12 PO) Take 1 tablet by mouth daily.    Marland Kitchen escitalopram (LEXAPRO) 10 MG tablet Take 1 tablet (10 mg total) by mouth daily. 90 tablet 0  . hydrochlorothiazide (HYDRODIURIL) 25 MG tablet Take 1 tablet (25 mg total) by mouth daily. 90 tablet 3  . HYDROcodone-acetaminophen (NORCO) 10-325 MG tablet Take 1 tablet by mouth every 6 (six) hours as needed for moderate pain.     Marland Kitchen loratadine (CLARITIN) 10 MG tablet Take 10 mg by mouth daily.    . naproxen (NAPROSYN) 500 MG tablet Take 1 tablet (500 mg total) by mouth 2 (two) times daily. (Patient taking differently: Take 500 mg by mouth daily as needed for moderate pain. ) 30 tablet 0  . omeprazole (PRILOSEC) 40 MG capsule TAKE 1 CAPSULE BY MOUTH EVERY DAY. (Patient taking differently: TAKE 1 CAPSULE BY MOUTH EVERY DAY AS NEEDED FOR ACID REFLUX) 90 capsule 3  . potassium chloride (KLOR-CON M10) 10 MEQ tablet Take 1 tablet (10 mEq total) by mouth daily. 90 tablet 3  . tiZANidine (ZANAFLEX) 4 MG tablet Take 1 tablet (4 mg total) by mouth at bedtime. 30 tablet 11   No current facility-administered medications for this visit.      Musculoskeletal: Strength & Muscle Tone: within normal limits Gait & Station: normal Patient leans: N/A  Psychiatric Specialty Exam: Review of Systems  Psychiatric/Behavioral: Negative for depression, hallucinations, memory loss, substance abuse and suicidal ideas. The patient is  nervous/anxious. The patient does not have insomnia.   All other systems reviewed and are negative.   Blood pressure (!) 155/90, pulse 88, height 5\' 4"  (1.626 m), weight 178 lb (80.7 kg), SpO2 97 %.Body mass index is 30.55 kg/m.  General Appearance: Fairly Groomed  Eye Contact:  Good  Speech:  Clear and Coherent  Volume:  Normal  Mood:  "good"  Affect:  Appropriate, Congruent and Full Range  Thought Process:  Coherent  Orientation:  Full (Time, Place, and Person)  Thought Content: Logical   Suicidal Thoughts:  No  Homicidal Thoughts:  No  Memory:  Immediate;   Good  Judgement:  Fair  Insight:  Present  Psychomotor Activity:  Normal  Concentration:  Concentration: Good and Attention Span: Good  Recall:  Good  Fund of Knowledge: Good  Language: Good  Akathisia:  No  Handed:  Right  AIMS (if indicated): not done  Assets:  Communication Skills Desire for Improvement  ADL's:  Intact  Cognition: WNL  Sleep:  Good   Screenings: PHQ2-9     Office Visit from 12/25/2017 in [facility] Clinical Support from 12/04/2017 in [facility] Office Visit from 11/13/2017 in [facility] Office Visit from 06/27/2017 in [facility] Office Visit from 03/30/2017 in [facility]  PHQ-2 Total Score  4  0  0  6  0  PHQ-9 Total Score  9  -  -  14  -     MOCA 20/30 on 07/12/2017 (-4 for visuospatial/executive, -1 for naming, -5 for delayed recall)  Assessment and Plan:  [name] Rojas is a 77 y.o. year old female with a history of depression, anxiety, fibromyalgia, hypertension, pulmonary hypertension, h/o stroke, GERD, s/p bariatric surgery, who presents for follow up appointment for Major depressive disorder, recurrent episode, mild with anxious distress (HCC)  # MDD, mild, recurrent without psychotic features # Unspecified anxiety disorder Patient reports overall good mood except occasional anxiety since her last appointment.  She has been taking  higher dose of Lexapro than recommended; discussed potential side effect of QTC prolongation.  Will continue Lexapro to target depression.  Discussed behavioral activation.  She is encouraged to continue to see her therapist.   # neurocognitive deficits She denies significant concern of her memory loss.  She has cognitive deficits as characterized by Moca.  ADL/IADL independent.  Labs with no treatable cause of dementia.  Will continue to monitor.   Plan I have reviewed and updated plans as below 1.Continue lexapro 10 mg daily 2. Return to clinic in four months for 15 mins - Sheis advisedto see Mr. Sheets for therapy  Past trials of medication: sertraline, Effexor,duloxetine,Wellbutrin, Buspar,  Neysa Hotter, MD 10/24/2018, 9:20 AM

## 2018-10-24 ENCOUNTER — Encounter (HOSPITAL_COMMUNITY): Payer: Self-pay | Admitting: Psychiatry

## 2018-10-24 ENCOUNTER — Ambulatory Visit (INDEPENDENT_AMBULATORY_CARE_PROVIDER_SITE_OTHER): Payer: Medicare Other | Admitting: Psychiatry

## 2018-10-24 VITALS — BP 155/90 | HR 88 | Ht 64.0 in | Wt 178.0 lb

## 2018-10-24 DIAGNOSIS — F330 Major depressive disorder, recurrent, mild: Secondary | ICD-10-CM

## 2018-10-24 NOTE — Patient Instructions (Addendum)
1.Continue lexapro 10 mg daily 2. Return to clinic in four months for 15 mins

## 2018-11-01 DIAGNOSIS — M797 Fibromyalgia: Secondary | ICD-10-CM | POA: Diagnosis not present

## 2018-11-01 DIAGNOSIS — Z79891 Long term (current) use of opiate analgesic: Secondary | ICD-10-CM | POA: Diagnosis not present

## 2018-11-01 DIAGNOSIS — M545 Low back pain: Secondary | ICD-10-CM | POA: Diagnosis not present

## 2018-11-01 DIAGNOSIS — M130 Polyarthritis, unspecified: Secondary | ICD-10-CM | POA: Diagnosis not present

## 2018-11-21 ENCOUNTER — Ambulatory Visit (HOSPITAL_COMMUNITY): Payer: Medicare Other | Admitting: Licensed Clinical Social Worker

## 2018-11-29 DIAGNOSIS — M545 Low back pain: Secondary | ICD-10-CM | POA: Diagnosis not present

## 2018-11-29 DIAGNOSIS — M62830 Muscle spasm of back: Secondary | ICD-10-CM | POA: Diagnosis not present

## 2018-12-20 DIAGNOSIS — H2513 Age-related nuclear cataract, bilateral: Secondary | ICD-10-CM | POA: Diagnosis not present

## 2018-12-20 DIAGNOSIS — H52223 Regular astigmatism, bilateral: Secondary | ICD-10-CM | POA: Diagnosis not present

## 2018-12-20 DIAGNOSIS — H524 Presbyopia: Secondary | ICD-10-CM | POA: Diagnosis not present

## 2018-12-22 ENCOUNTER — Encounter (HOSPITAL_COMMUNITY): Payer: Self-pay | Admitting: Emergency Medicine

## 2018-12-22 ENCOUNTER — Emergency Department (HOSPITAL_COMMUNITY): Payer: Medicare Other

## 2018-12-22 ENCOUNTER — Emergency Department (HOSPITAL_COMMUNITY)
Admission: EM | Admit: 2018-12-22 | Discharge: 2018-12-22 | Disposition: A | Discharge status: Home / Self Care | Payer: Medicare Other | Attending: Emergency Medicine | Admitting: Emergency Medicine

## 2018-12-22 DIAGNOSIS — G8929 Other chronic pain: Secondary | ICD-10-CM | POA: Diagnosis not present

## 2018-12-22 DIAGNOSIS — M48061 Spinal stenosis, lumbar region without neurogenic claudication: Secondary | ICD-10-CM | POA: Diagnosis not present

## 2018-12-22 DIAGNOSIS — M47816 Spondylosis without myelopathy or radiculopathy, lumbar region: Secondary | ICD-10-CM | POA: Diagnosis not present

## 2018-12-22 DIAGNOSIS — I10 Essential (primary) hypertension: Secondary | ICD-10-CM | POA: Insufficient documentation

## 2018-12-22 DIAGNOSIS — M545 Low back pain: Secondary | ICD-10-CM | POA: Insufficient documentation

## 2018-12-22 DIAGNOSIS — R55 Syncope and collapse: Secondary | ICD-10-CM | POA: Diagnosis present

## 2018-12-22 DIAGNOSIS — Z79899 Other long term (current) drug therapy: Secondary | ICD-10-CM | POA: Diagnosis not present

## 2018-12-22 DIAGNOSIS — M5137 Other intervertebral disc degeneration, lumbosacral region: Secondary | ICD-10-CM | POA: Diagnosis not present

## 2018-12-22 LAB — COMPREHENSIVE METABOLIC PANEL
ALT: 21 U/L (ref 0–44)
AST: 23 U/L (ref 15–41)
Albumin: 4.6 g/dL (ref 3.5–5.0)
Alkaline Phosphatase: 46 U/L (ref 38–126)
Anion gap: 6 (ref 5–15)
BILIRUBIN TOTAL: 0.6 mg/dL (ref 0.3–1.2)
BUN: 15 mg/dL (ref 8–23)
CALCIUM: 9.4 mg/dL (ref 8.9–10.3)
CHLORIDE: 102 mmol/L (ref 98–111)
CO2: 27 mmol/L (ref 22–32)
Creatinine, Ser: 0.67 mg/dL (ref 0.44–1.00)
GFR calc Af Amer: >60 mL/min (ref >60)
GLUCOSE: 142 mg/dL — AB (ref 70–99)
POTASSIUM: 3.8 mmol/L (ref 3.5–5.1)
SODIUM: 135 mmol/L (ref 135–145)
Total Protein: 7.2 g/dL (ref 6.5–8.1)

## 2018-12-22 LAB — CBC WITH DIFFERENTIAL/PLATELET
Abs Immature Granulocytes: 0.01 10*3/uL (ref 0.00–0.07)
BASOS ABS: 0 10*3/uL (ref 0.0–0.1)
BASOS PCT: 0 %
EOS PCT: 1 %
Eosinophils Absolute: 0.1 10*3/uL (ref 0.0–0.5)
HCT: 37.2 % (ref 36.0–46.0)
HEMOGLOBIN: 11.9 g/dL — AB (ref 12.0–15.0)
IMMATURE GRANULOCYTES: 0 %
Lymphocytes Relative: 24 %
Lymphs Abs: 1.2 10*3/uL (ref 0.7–4.0)
MCH: 33.1 pg (ref 26.0–34.0)
MCHC: 32 g/dL (ref 30.0–36.0)
MCV: 103.6 fL — ABNORMAL HIGH (ref 80.0–100.0)
MONO ABS: 0.4 10*3/uL (ref 0.1–1.0)
MONOS PCT: 8 %
NEUTROS ABS: 3.4 10*3/uL (ref 1.7–7.7)
NRBC: 0 % (ref 0.0–0.2)
Neutrophils Relative %: 67 %
Platelets: 243 10*3/uL (ref 150–400)
RBC: 3.59 MIL/uL — ABNORMAL LOW (ref 3.87–5.11)
RDW: 13.5 % (ref 11.5–15.5)
WBC: 5.1 10*3/uL (ref 4.0–10.5)

## 2018-12-22 LAB — TROPONIN I: Troponin I: <0.03 ng/mL (ref <0.03)

## 2018-12-22 LAB — SALICYLATE LEVEL: Salicylate Lvl: <7 mg/dL (ref 2.8–30.0)

## 2018-12-22 LAB — ACETAMINOPHEN LEVEL: Acetaminophen (Tylenol), Serum: 10 ug/mL (ref 10–30)

## 2018-12-22 MED ORDER — METHOCARBAMOL 500 MG PO TABS: 500.0000 mg | ORAL_TABLET | Freq: Two times a day (BID) | ORAL | 0 refills | Status: AC | PRN

## 2018-12-22 NOTE — ED Notes (Signed)
Assisted pt to the bathroom. Pt stated she was not in pain due to taking personal pain medication.

## 2018-12-22 NOTE — ED Triage Notes (Signed)
Pt states she has been having lower back pain "for quite some time."  Has been on hydrocodone for her pain for many years and feels it is no longer helping her pain.  Had a nerve stimulator placed which helped but had to be removed.  States where this was removed always burns like fire.  Denies injury and this pain has been ongoing >1 week.

## 2018-12-22 NOTE — Discharge Instructions (Addendum)
Follow up with your pain doctor at your scheduled appointment next week.  Follow up with primary care doctor in 2-5 days to further discuss today's visit.  We talked about organizing your pills into a weekly container with the labeled days of the week and AM/PM sections. It is important to take your medicine as prescribed.  You have been prescribed Robaxin as a muscle relaxer. You do not have to take it every day. You should only take it when you have back pain. The most you can take are two doses per day. The bottle has the directions listed on it, please follow those. Talk to your primary care doctor and pain doctor for future treatment choices about your chronic back pain.   It is important you stay as active as possible to keep your muscles from getting stiff. You are doing a great job so far staying active, keep it up!  Do not take extra tylenol if you are taking your hydrocodone because it already has Tylenol in it. Take the hydrocodone as prescribed, follow the directions on the bottle.  It is important not to take anti-inflammatory medications every day because they can cause ulcers and other problems. Please talk about proper use anti-inflammatories with your primary care doctor.   It is also a possibility that you have an allergic reaction to any of the medicines that you have been prescribed - Everybody reacts differently to medications and while MOST people have no trouble with most medicines, you may have a reaction such as nausea, vomiting, rash, swelling, shortness of breath. If this is the case, please stop taking the medicine immediately and contact your physician.  ?  You should return to the ER if you develop severe or worsening symptoms.

## 2018-12-22 NOTE — ED Notes (Signed)
Pt says she is on pain medication but it "makes me sick, it takes the pain down and me too."  Denies injury history of back pain.

## 2018-12-22 NOTE — ED Notes (Signed)
Pt transported to radiology by Kyle. 

## 2018-12-22 NOTE — ED Provider Notes (Signed)
MSE was initiated and I personally evaluated the patient and placed orders (if any) at  12:03 PM on December 22, 2018.  The patient appears stable so that the remainder of the MSE may be completed by another provider.  Patient presents here with a history of chronic low back pain which is not been controlled by her current medication regimen and so has added several over-the-counter medicines including Excedrin and Tylenol.  She has persistent low back pain, but is also very concerned about episodes of weakness and near syncope, one occurring yesterday and another one this morning just prior to arrival.  She is concerned about possibly being overmedicated.  She denies chest pain, shortness of breath or focal weakness.  Patient will need further evaluation for her near syncopal episodes and will be moved to the acute care side.     Burgess Amor, PA-C 12/22/18 1204    Loren Racer, MD 12/22/18 [phone]

## 2018-12-23 NOTE — ED Provider Notes (Signed)
[facility] EMERGENCY DEPARTMENT Provider Note   CSN: [id number] Arrival date & time: 12/22/18  1054     History   Chief Complaint Chief Complaint  Patient presents with  . Back Pain    HPI [name] Rojas is a 78 y.o. female with a medical history of chronic back pain, fibromyalgia,hypertension presenting to emergency department with near syncopal episodes x 3 days. She reports that she has felt like she will pass out, but has not. When she feels this way she sits down and the feeling passes, it only lasts a few minutes.  Pt sees a pain doctor for management of her chronic back pain and is prescribed Norco q6hours prn for back pain. Her back pain has been bothering her over the last few weeks so she has also been taking Tylenol and Ibuprofen. She cannot remember how often she is taking the Tylenol and Ibuprofen but says she has been taking them "every day for awhile."  Pt reports low back pain x 1 week. This feels like the chronic back pain she has had in the past. The pain is better at rest. It does not radiate.  She denies chest pain, shortness of breath, history of cancer, changes in bowel and bladder functions, IVDU, weight loss, saddle anesthesia, numbness, and tingling, recent injury, falls.    Past Medical History:  Diagnosis Date  . Anemia   . Anxiety   . Arthritis   . Chronic back pain   . Depression   . Fibromyalgia   . GERD (gastroesophageal reflux disease)   . Hypertension   . Osteoporosis   . Stroke [facility]HCC)     Patient Active Problem List   Diagnosis Date Noted  . Gait instability   . Vertigo   . Dizziness 06/10/2018  . CVA (cerebral vascular accident) (HCC) 06/10/2018  . Major depressive disorder, recurrent episode, mild with anxious distress (HCC) 09/25/2017  . Mild neurocognitive disorder 07/12/2017  . Vitamin D deficiency 06/27/2017  . Osteoporosis 06/27/2017  . Fibromyalgia 03/30/2017  . History of medication noncompliance 03/30/2017  . Osteoporosis of  lower leg associated with endocrine disorder 03/30/2017  . Primary osteoarthritis of left shoulder 04/11/2016  . Hearing difficulty 09/17/2015  . Neuropathic pain 04/10/2013  . Anxiety disorder 04/10/2013  . Failed back syndrome 08/24/2012  . Pulmonary hypertension (HCC) 04/25/2012  . Congenital heart disease with intracardiac shunting 04/25/2012  . History of stroke 05/23/2011  . Status post total knee replacement 02/03/2010  . GERD (gastroesophageal reflux disease) 02/03/2010  . Chronic pain 02/02/2010  . Essential hypertension 01/15/2010  . Status post bariatric surgery 04/30/2009    Past Surgical History:  Procedure Laterality Date  . ABDOMINAL HYSTERECTOMY     bleeding  . BACK SURGERY    . GASTRIC BYPASS    . KNEE SURGERY    . TONSILLECTOMY       OB History   No obstetric history on file.      Home Medications    Prior to Admission medications   Medication Sig Start Date End Date Taking? Authorizing Provider  Aspirin-Acetaminophen-Caffeine (PAIN RELIEVER PLUS PO) Take 1 tablet by mouth daily as needed.   Yes [provider]  calcium-vitamin D (OSCAL WITH D) 500-200 MG-UNIT tablet Take 1 tablet by mouth daily with breakfast.   Yes [provider]  cholecalciferol (VITAMIN D) 1000 units tablet Take 1,000 Units by mouth daily.   Yes [provider]  escitalopram (LEXAPRO) 10 MG tablet Take 1 tablet (10 mg  total) by mouth daily. 10/10/18  Yes Hisada, Barbee Cougheina, MD  hydrochlorothiazide (HYDRODIURIL) 25 MG tablet Take 1 tablet (25 mg total) by mouth daily. 08/03/17  Yes Eustace MooreNelson, Yvonne Sue, MD  HYDROcodone-acetaminophen [facility](NORCO) 10-325 MG tablet Take 1 tablet by mouth every 6 (six) hours as needed for moderate pain.  11/13/17  Yes [provider]  loratadine (CLARITIN) 10 MG tablet Take 10 mg by mouth daily.   Yes [provider]  naproxen sodium (ALEVE) 220 MG tablet Take 220 mg by mouth daily as needed.   Yes [provider]    potassium chloride (KLOR-CON M10) 10 MEQ tablet Take 1 tablet (10 mEq total) by mouth daily. 08/03/17  Yes Eustace MooreNelson, Yvonne Sue, MD  tiZANidine (ZANAFLEX) 2 MG tablet Take 1-2 tablets by mouth at bedtime as needed. 10/26/18  Yes [provider]  methocarbamol (ROBAXIN) 500 MG tablet Take 1 tablet (500 mg total) by mouth 2 (two) times daily as needed for muscle spasms. 12/22/18   Albrizze, Kaitlyn E, PA-C  naproxen (NAPROSYN) 500 MG tablet Take 1 tablet (500 mg total) by mouth 2 (two) times daily. Patient taking differently: Take 500 mg by mouth daily as needed for moderate pain.  03/06/18   Bethel BornGekas, Kelly Marie, PA-C    Family History Family History  Problem Relation Age of Onset  . Arthritis Mother   . Asthma Mother   . Heart disease Mother   . Early death Father        trauma  . Heart disease Sister   . Stroke Sister     Social History Social History   Tobacco Use  . Smoking status: Never Smoker  . Smokeless tobacco: Never Used  Substance Use Topics  . Alcohol use: No  . Drug use: No     Allergies   Codeine; Iodinated diagnostic agents; Lyrica [pregabalin]; Statins; and Tramadol   Review of Systems Review of Systems  Constitutional: Negative for chills, fever and unexpected weight change.  HENT: Negative for congestion, sinus pressure and sore throat.   Eyes: Negative for pain and visual disturbance.  Respiratory: Negative for chest tightness and shortness of breath.   Cardiovascular: Negative for chest pain and palpitations.  Gastrointestinal: Negative for abdominal pain, diarrhea, nausea and vomiting.  Genitourinary: Negative for difficulty urinating and hematuria.  Musculoskeletal: Positive for back pain. Negative for neck pain.  Skin: Negative for rash and wound.  Neurological: Negative for dizziness, syncope and headaches.     Physical Exam Updated Vital Signs BP (!) 147/105 (BP Location: Left Arm)   Pulse 90   Temp 98.4 F (36.9 C) (Oral)   Resp 16    Ht 5\' 4"  (1.626 m)   Wt 78.9 kg   SpO2 96%   BMI 29.87 kg/m   Physical Exam Vitals signs and nursing note reviewed.  Constitutional:      Appearance: She is not ill-appearing or toxic-appearing.  HENT:     Head: Normocephalic and atraumatic.     Nose: Nose normal.     Mouth/Throat:     Mouth: Mucous membranes are moist.     Pharynx: Oropharynx is clear.  Eyes:     Conjunctiva/sclera: Conjunctivae normal.  Neck:     Musculoskeletal: Normal range of motion.  Cardiovascular:     Rate and Rhythm: Normal rate and regular rhythm.     Pulses: Normal pulses.     Heart sounds: Normal heart sounds.  Pulmonary:     Effort: Pulmonary effort is normal.  Breath sounds: Normal breath sounds.  Abdominal:     General: Bowel sounds are normal. There is no distension.     Palpations: Abdomen is soft.     Tenderness: There is no abdominal tenderness. There is no guarding or rebound.  Musculoskeletal: Normal range of motion.  Skin:    General: Skin is warm and dry.     Capillary Refill: Capillary refill takes less than 2 seconds.  Neurological:     Mental Status: She is alert. Mental status is at baseline.     Motor: No weakness.     Comments: Speech is clear and goal oriented, follows commands CN 3-12 without deficit, no facial droop Normal strength in upper and lower extremities bilaterally including dorsiflexion and plantar flexion, strong and equal grip strength Sensation intact to light touch Moves extremities without ataxia, coordination intact Normal finger to nose and rapid alternating movements Normal gait and balance   Psychiatric:        Mood and Affect: Mood normal.        Behavior: Behavior normal.        Judgment: Judgment normal.      ED Treatments / Results  Labs (all labs ordered are listed, but only abnormal results are displayed) Labs Reviewed  COMPREHENSIVE METABOLIC PANEL - Abnormal; Notable for the following components:      Result Value   Glucose, Bld  142 (*)    All other components within normal limits  CBC WITH DIFFERENTIAL/PLATELET - Abnormal; Notable for the following components:   RBC 3.59 (*)    Hemoglobin 11.9 (*)    MCV 103.6 (*)    All other components within normal limits  ACETAMINOPHEN LEVEL  SALICYLATE LEVEL  TROPONIN I    EKG EKG Interpretation  Date/Time:  Saturday December 22 2018 12:21:02 EST Ventricular Rate:  80 PR Interval:    QRS Duration: 89 QT Interval:  361 QTC Calculation: 417 R Axis:   -15 Text Interpretation:  Sinus rhythm Abnormal R-wave progression, early transition Left ventricular hypertrophy compared wtih prior - no significant changes Confirmed by Eber Hong ([id number]) on 12/22/2018 12:54:26 PM   Radiology Dg Lumbar Spine Complete  Result Date: 12/22/2018 CLINICAL DATA:  Low back pain chronically worse over the past week. EXAM: LUMBAR SPINE - COMPLETE 4+ VIEW COMPARISON:  08/09/2017 FINDINGS: Diffuse decreased bone density. There is mild spondylosis throughout the lumbar spine. Posterior fusion hardware from L4-S1 intact and unchanged. Vertebral body heights are maintained. No compression fracture or spondylolisthesis. Mild disc space narrowing at the L3-4, L4-5 and L5-S1 levels. Remainder of the exam is unchanged. IMPRESSION: No acute findings. Mild spondylosis throughout the lumbar spine with multilevel disc disease from the L3-4 level to the L5-S1 level. Stable posterior fusion hardware from L4-S1. Electronically Signed   By: Elberta Fortis M.D.   On: 12/22/2018 14:00    Procedures Procedures (including critical care time)  Medications Ordered in ED Medications - No data to display   Initial Impression / Assessment and Plan / ED Course  I have reviewed the triage vital signs and the nursing notes.  Pertinent labs & imaging results that were available during my care of the patient were reviewed by me and considered in my medical decision making (see chart for details).     Based on the pt's  HPI, her near syncopal episodes do not seem cardiac in nature. With the exact amount and duration of Ibuprofen and Tylenol use unknown, will check labs for Acetaminophen and salicylate  level. She does not present with acute signs of a salicylate overdose, but with her inconsistent usage it would be beneficial to know her level. Both salicylate and acetaminophen levels are unremarkable. CBC results without signs of infection, CMP unremarkable. Troponin negative. Her EKG today remains unchaged compared to prior.   Due to her complaint of back pain, I reviewed her previous ED visits and imaging records. Her last lumbar spine xrays were from 07/2017 so will get lumbar spine xrays today looking for possible fractures given her age. Imaging was negative for acute findings and does show mild spondylosis throughout the lumbar spine with multilevel disc disease from the L3-4 level to the L5-S1 level. I reviewed the images and agree with the radiologist. Pt could benefit from short term use of muscle relaxer in adjunct to her Norco. Robaxin prescribed at discharge.  Pt encouraged to follow up with pcp regarding today's visit.  Pt already has scheduled appointment with her pain doctor next week. She is stable to follow up outpatient. I discussed the importance of taking medicine as prescribed and not taking extra Tylenol and Ibuprofen. We also discussed at length how to organize her medications into a weekly container with AM/PM sections to make sure she is taking them as prescribed and not missing doses or taking extra.  Pt is in agreement with this plan and does not have further questions.   The patient was discussed with and seen by Dr. Hyacinth Meeker who agrees with the treatment plan.   Final Clinical Impressions(s) / ED Diagnoses   Final diagnoses:  Chronic bilateral low back pain without sciatica    ED Discharge Orders         Ordered    methocarbamol (ROBAXIN) 500 MG tablet  2 times daily PRN     12/22/18  1443           Albrizze, Caroleen Hamman, PA-C 12/24/18 0005    Eber Hong, MD 12/24/18 1736

## 2018-12-24 DIAGNOSIS — M545 Low back pain: Secondary | ICD-10-CM | POA: Diagnosis not present

## 2018-12-24 DIAGNOSIS — M62830 Muscle spasm of back: Secondary | ICD-10-CM | POA: Diagnosis not present

## 2018-12-24 DIAGNOSIS — R42 Dizziness and giddiness: Secondary | ICD-10-CM | POA: Diagnosis not present

## 2018-12-27 DIAGNOSIS — M797 Fibromyalgia: Secondary | ICD-10-CM | POA: Diagnosis not present

## 2018-12-27 DIAGNOSIS — Z79891 Long term (current) use of opiate analgesic: Secondary | ICD-10-CM | POA: Diagnosis not present

## 2018-12-27 DIAGNOSIS — M130 Polyarthritis, unspecified: Secondary | ICD-10-CM | POA: Diagnosis not present

## 2018-12-27 DIAGNOSIS — M545 Low back pain: Secondary | ICD-10-CM | POA: Diagnosis not present

## 2019-01-02 DIAGNOSIS — M79672 Pain in left foot: Secondary | ICD-10-CM | POA: Diagnosis not present

## 2019-01-02 DIAGNOSIS — I739 Peripheral vascular disease, unspecified: Secondary | ICD-10-CM | POA: Diagnosis not present

## 2019-01-02 DIAGNOSIS — M79671 Pain in right foot: Secondary | ICD-10-CM | POA: Diagnosis not present

## 2019-01-03 DIAGNOSIS — M797 Fibromyalgia: Secondary | ICD-10-CM | POA: Diagnosis not present

## 2019-01-03 DIAGNOSIS — Z79891 Long term (current) use of opiate analgesic: Secondary | ICD-10-CM | POA: Diagnosis not present

## 2019-01-03 DIAGNOSIS — M545 Low back pain: Secondary | ICD-10-CM | POA: Diagnosis not present

## 2019-01-03 DIAGNOSIS — M130 Polyarthritis, unspecified: Secondary | ICD-10-CM | POA: Diagnosis not present

## 2019-01-08 DIAGNOSIS — M2550 Pain in unspecified joint: Secondary | ICD-10-CM | POA: Diagnosis not present

## 2019-01-14 DIAGNOSIS — G3184 Mild cognitive impairment, so stated: Secondary | ICD-10-CM | POA: Diagnosis not present

## 2019-01-14 DIAGNOSIS — I10 Essential (primary) hypertension: Secondary | ICD-10-CM | POA: Diagnosis not present

## 2019-01-14 DIAGNOSIS — K219 Gastro-esophageal reflux disease without esophagitis: Secondary | ICD-10-CM | POA: Diagnosis not present

## 2019-01-21 DIAGNOSIS — K219 Gastro-esophageal reflux disease without esophagitis: Secondary | ICD-10-CM | POA: Diagnosis not present

## 2019-01-21 DIAGNOSIS — I10 Essential (primary) hypertension: Secondary | ICD-10-CM | POA: Diagnosis not present

## 2019-01-21 DIAGNOSIS — G3184 Mild cognitive impairment, so stated: Secondary | ICD-10-CM | POA: Diagnosis not present

## 2019-01-29 DIAGNOSIS — Z79891 Long term (current) use of opiate analgesic: Secondary | ICD-10-CM | POA: Diagnosis not present

## 2019-01-29 DIAGNOSIS — M797 Fibromyalgia: Secondary | ICD-10-CM | POA: Diagnosis not present

## 2019-01-29 DIAGNOSIS — M545 Low back pain: Secondary | ICD-10-CM | POA: Diagnosis not present

## 2019-01-29 DIAGNOSIS — M130 Polyarthritis, unspecified: Secondary | ICD-10-CM | POA: Diagnosis not present

## 2019-02-04 DIAGNOSIS — H40013 Open angle with borderline findings, low risk, bilateral: Secondary | ICD-10-CM | POA: Diagnosis not present

## 2019-02-04 DIAGNOSIS — H25813 Combined forms of age-related cataract, bilateral: Secondary | ICD-10-CM | POA: Diagnosis not present

## 2019-02-04 DIAGNOSIS — H52223 Regular astigmatism, bilateral: Secondary | ICD-10-CM | POA: Diagnosis not present

## 2019-02-11 DIAGNOSIS — I10 Essential (primary) hypertension: Secondary | ICD-10-CM | POA: Diagnosis not present

## 2019-02-12 IMAGING — CT CT HEAD W/O CM
3 series · 16 of 46 positions shown, 19 images · non-contrast
Comparison: None.

CLINICAL DATA: Headache.

EXAM:
CT HEAD WITHOUT CONTRAST
TECHNIQUE: Contiguous axial images were obtained from the base of the skull
through the vertex without intravenous contrast.

[Series 2: head wo · axial · 0.43mm/px · z∈[+1094,+1214]mm · 10 of 29 slices shown, 13 images]
[im 3/29  brain]
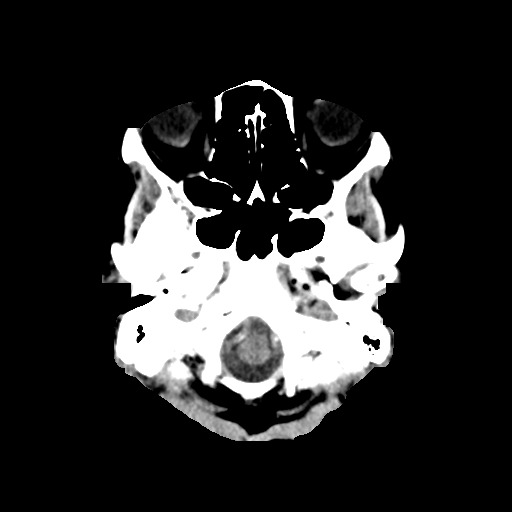
[im 3/29  bone]
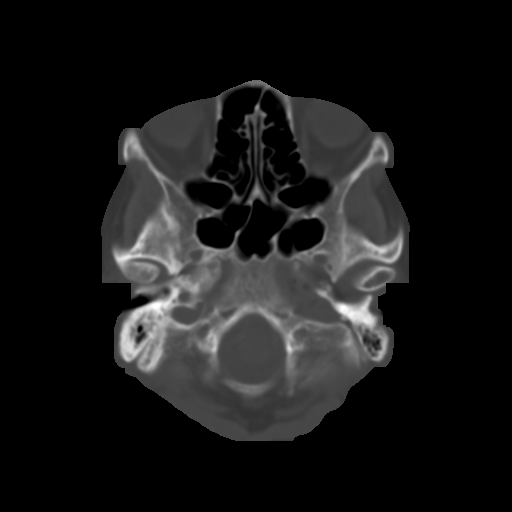
[im 6/29  brain]
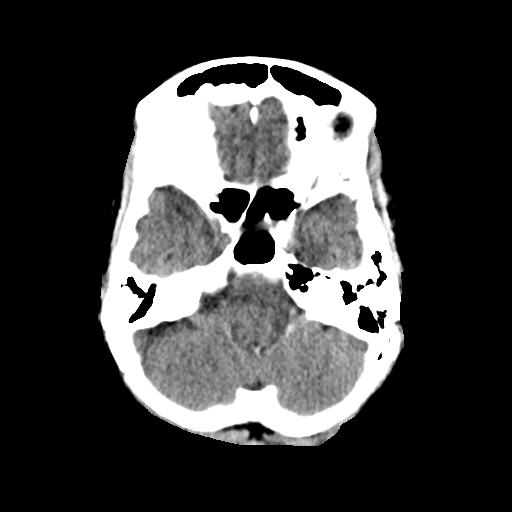
[im 8/29  brain]
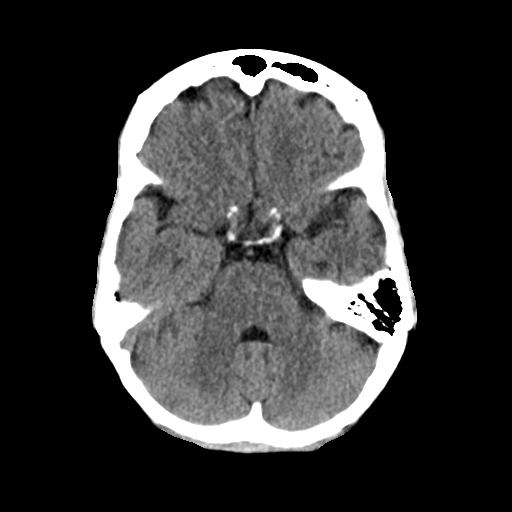
[im 11/29  brain]
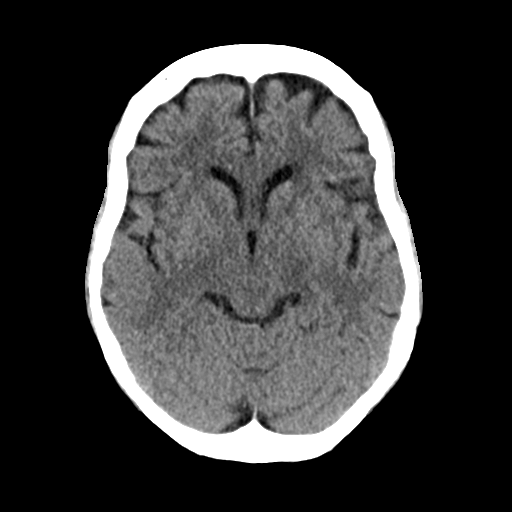
[im 14/29  brain]
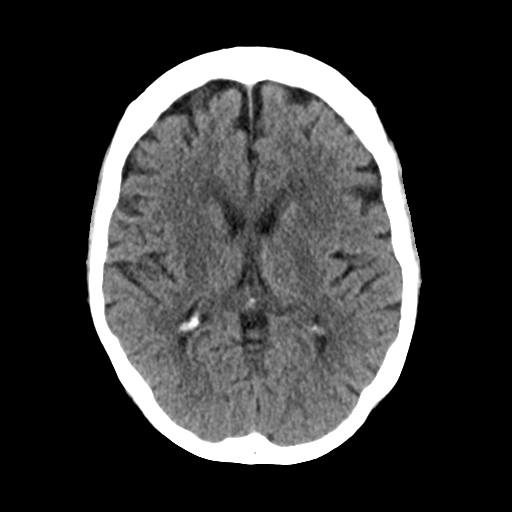
[im 14/29  bone]
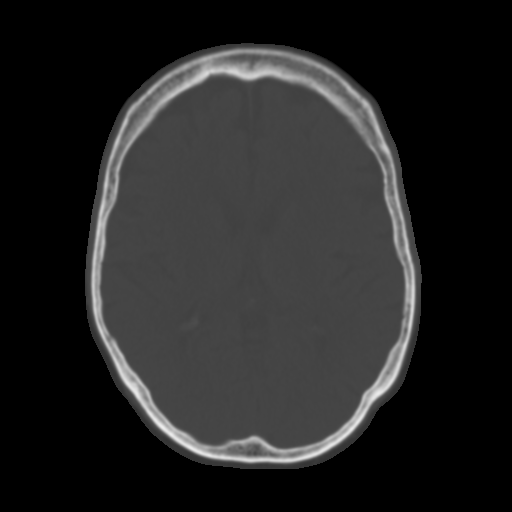
[im 16/29  brain]
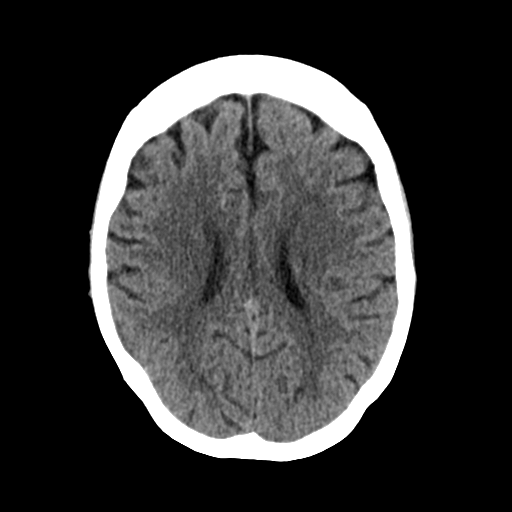
[im 19/29  brain]
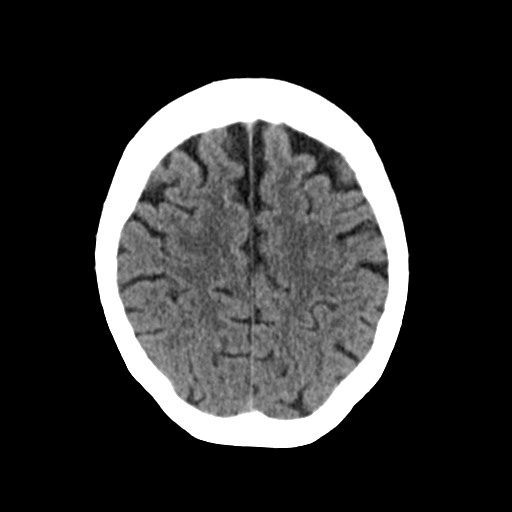
[im 22/29  brain]
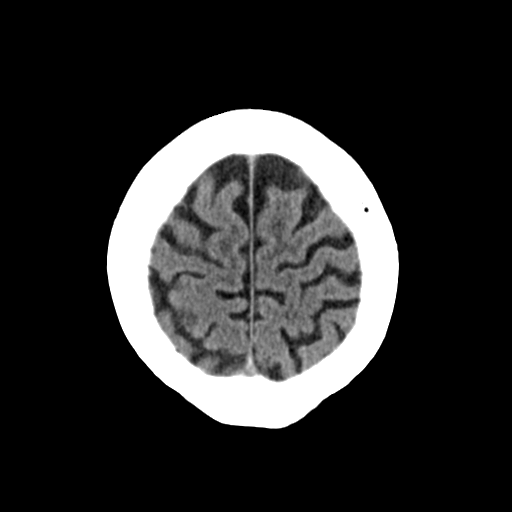
[im 24/29  brain]
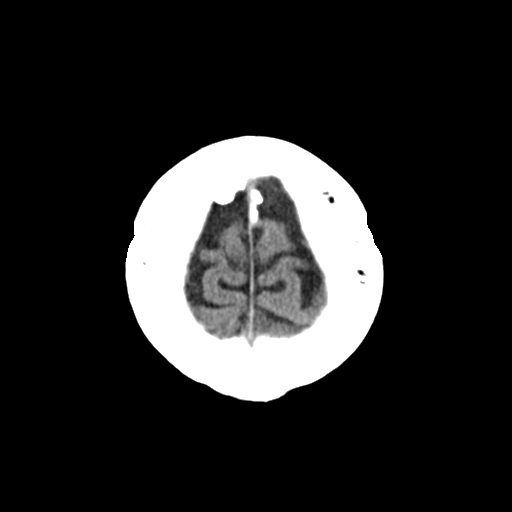
[im 24/29  bone]
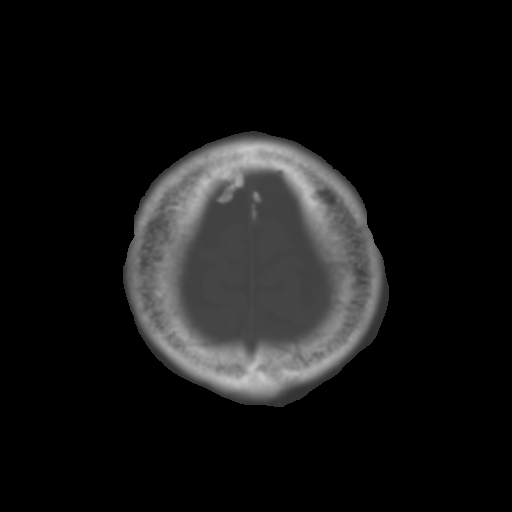
[im 27/29  brain]
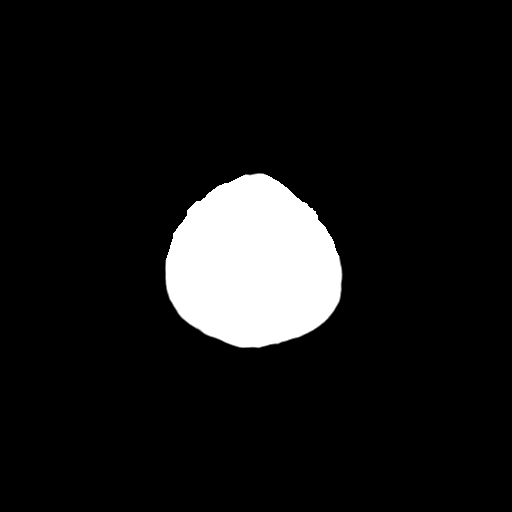

[Series 4: coronal soft tissue · coronal · 0.34mm/px · 3 of 67 slices shown]
[im 23/67  brain]
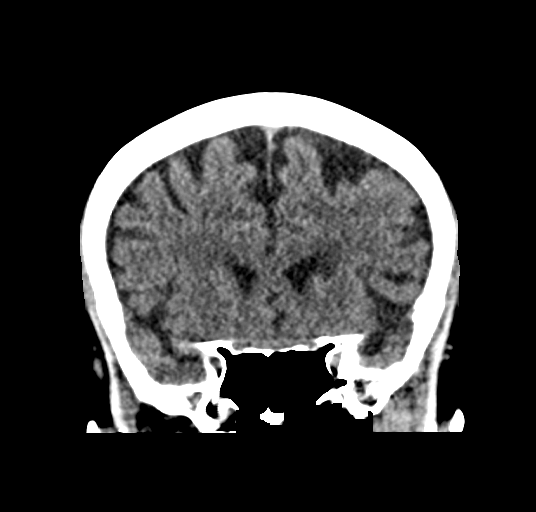
[im 30/67  brain]
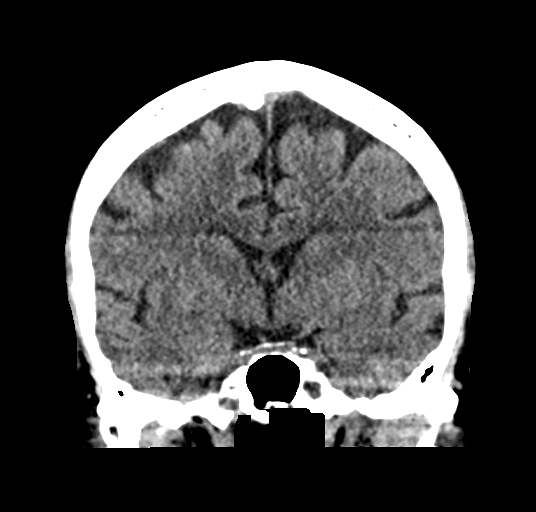
[im 37/67  brain]
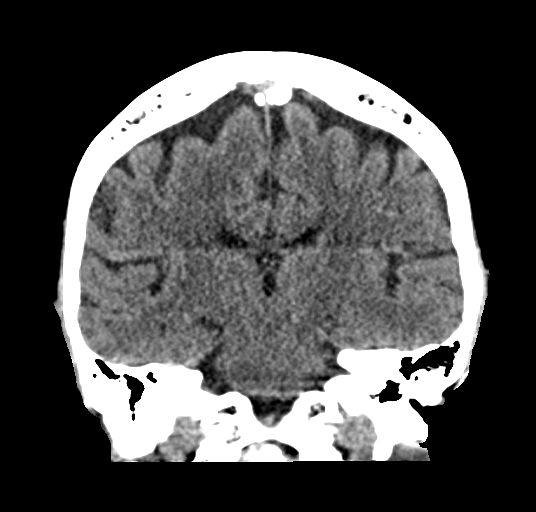

[Series 5: sagittal soft tissue · sagittal · 0.33mm/px · 3 of 57 slices shown]
[im 19/57  brain]
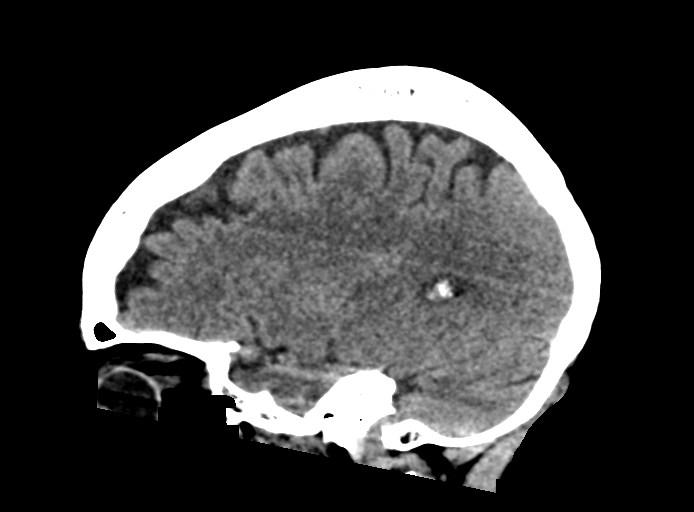
[im 29/57  brain]
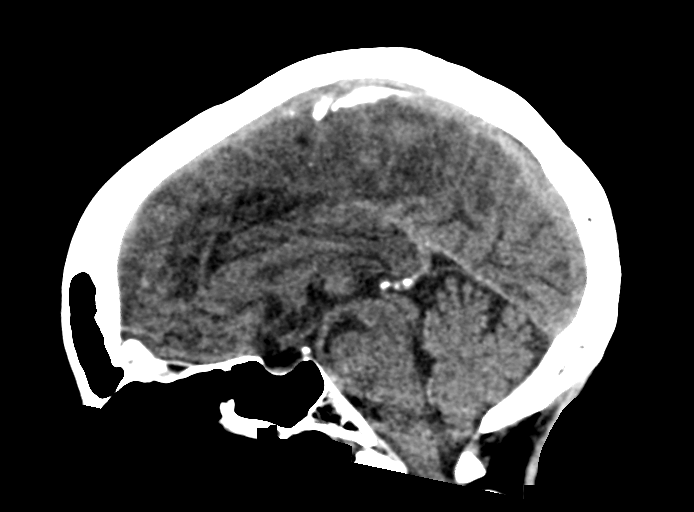
[im 38/57  brain]
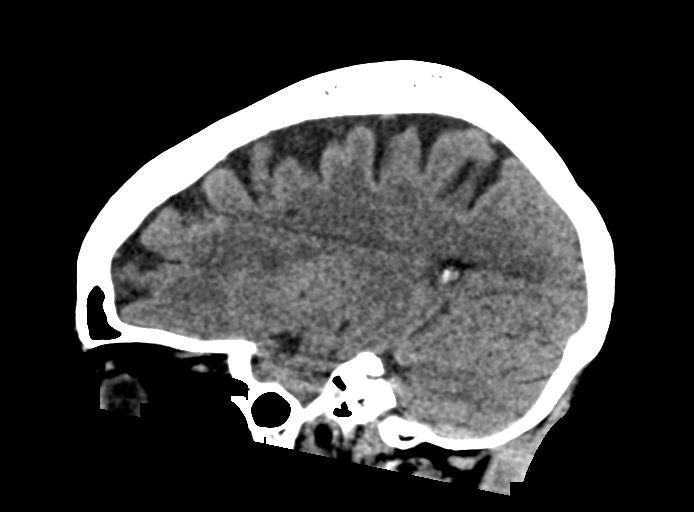

[16 of 46 positions shown; findings below may reference images not displayed]

FINDINGS: Brain: Mild chronic ischemic white matter disease is noted. No mass
effect or midline shift is noted. Ventricular size is within normal
limits. There is no evidence of mass lesion, hemorrhage or acute
infarction.

Vascular: No hyperdense vessel or unexpected calcification.

Skull: Normal. Negative for fracture or focal lesion.

Sinuses/Orbits: No acute finding.

Other: None.
IMPRESSION: Mild chronic ischemic white matter disease. No acute intracranial
abnormality seen.

## 2019-02-14 DIAGNOSIS — J309 Allergic rhinitis, unspecified: Secondary | ICD-10-CM | POA: Diagnosis not present

## 2019-02-14 DIAGNOSIS — I10 Essential (primary) hypertension: Secondary | ICD-10-CM | POA: Diagnosis not present

## 2019-02-14 DIAGNOSIS — M545 Low back pain: Secondary | ICD-10-CM | POA: Diagnosis not present

## 2019-02-14 DIAGNOSIS — M62830 Muscle spasm of back: Secondary | ICD-10-CM | POA: Diagnosis not present

## 2019-02-14 DIAGNOSIS — K219 Gastro-esophageal reflux disease without esophagitis: Secondary | ICD-10-CM | POA: Diagnosis not present

## 2019-02-26 DIAGNOSIS — M797 Fibromyalgia: Secondary | ICD-10-CM | POA: Diagnosis not present

## 2019-02-26 DIAGNOSIS — M130 Polyarthritis, unspecified: Secondary | ICD-10-CM | POA: Diagnosis not present

## 2019-02-26 DIAGNOSIS — Z79891 Long term (current) use of opiate analgesic: Secondary | ICD-10-CM | POA: Diagnosis not present

## 2019-02-26 DIAGNOSIS — M545 Low back pain: Secondary | ICD-10-CM | POA: Diagnosis not present

## 2019-03-01 ENCOUNTER — Encounter (HOSPITAL_COMMUNITY): Payer: Self-pay

## 2019-03-01 DIAGNOSIS — K219 Gastro-esophageal reflux disease without esophagitis: Secondary | ICD-10-CM | POA: Diagnosis not present

## 2019-03-01 DIAGNOSIS — G3184 Mild cognitive impairment, so stated: Secondary | ICD-10-CM | POA: Diagnosis not present

## 2019-03-01 DIAGNOSIS — I10 Essential (primary) hypertension: Secondary | ICD-10-CM | POA: Diagnosis not present

## 2019-03-04 ENCOUNTER — Encounter (HOSPITAL_COMMUNITY)
Admission: RE | Admit: 2019-03-04 | Discharge: 2019-03-04 | Disposition: A | Discharge status: Home / Self Care | Payer: Medicare Other | Source: Ambulatory Visit | Attending: Ophthalmology | Admitting: Ophthalmology

## 2019-03-04 ENCOUNTER — Other Ambulatory Visit: Payer: Self-pay

## 2019-03-11 ENCOUNTER — Ambulatory Visit: Admit: 2019-03-11 | Payer: Medicare Other | Admitting: Ophthalmology

## 2019-03-11 SURGERY — PHACOEMULSIFICATION, CATARACT, WITH IOL INSERTION
Anesthesia: Monitor Anesthesia Care | Laterality: Right

## 2019-03-16 ENCOUNTER — Emergency Department (HOSPITAL_COMMUNITY): Payer: Medicare Other

## 2019-03-16 ENCOUNTER — Emergency Department (HOSPITAL_COMMUNITY)
Admission: EM | Admit: 2019-03-16 | Discharge: 2019-03-16 | Disposition: A | Discharge status: Home / Self Care | Payer: Medicare Other | Attending: Emergency Medicine | Admitting: Emergency Medicine

## 2019-03-16 ENCOUNTER — Encounter (HOSPITAL_COMMUNITY): Payer: Self-pay | Admitting: Emergency Medicine

## 2019-03-16 ENCOUNTER — Other Ambulatory Visit: Payer: Self-pay

## 2019-03-16 DIAGNOSIS — F419 Anxiety disorder, unspecified: Secondary | ICD-10-CM | POA: Insufficient documentation

## 2019-03-16 DIAGNOSIS — F329 Major depressive disorder, single episode, unspecified: Secondary | ICD-10-CM | POA: Insufficient documentation

## 2019-03-16 DIAGNOSIS — R0602 Shortness of breath: Secondary | ICD-10-CM | POA: Diagnosis not present

## 2019-03-16 DIAGNOSIS — R531 Weakness: Secondary | ICD-10-CM | POA: Diagnosis not present

## 2019-03-16 DIAGNOSIS — R1084 Generalized abdominal pain: Secondary | ICD-10-CM | POA: Diagnosis not present

## 2019-03-16 DIAGNOSIS — Z8673 Personal history of transient ischemic attack (TIA), and cerebral infarction without residual deficits: Secondary | ICD-10-CM | POA: Diagnosis not present

## 2019-03-16 DIAGNOSIS — Z79899 Other long term (current) drug therapy: Secondary | ICD-10-CM | POA: Diagnosis not present

## 2019-03-16 DIAGNOSIS — I10 Essential (primary) hypertension: Secondary | ICD-10-CM | POA: Insufficient documentation

## 2019-03-16 DIAGNOSIS — Z9884 Bariatric surgery status: Secondary | ICD-10-CM | POA: Insufficient documentation

## 2019-03-16 DIAGNOSIS — B349 Viral infection, unspecified: Secondary | ICD-10-CM | POA: Insufficient documentation

## 2019-03-16 DIAGNOSIS — N200 Calculus of kidney: Secondary | ICD-10-CM | POA: Diagnosis not present

## 2019-03-16 DIAGNOSIS — K760 Fatty (change of) liver, not elsewhere classified: Secondary | ICD-10-CM | POA: Diagnosis not present

## 2019-03-16 DIAGNOSIS — R05 Cough: Secondary | ICD-10-CM | POA: Diagnosis not present

## 2019-03-16 LAB — URINALYSIS, ROUTINE W REFLEX MICROSCOPIC
BILIRUBIN URINE: NEGATIVE
Glucose, UA: NEGATIVE mg/dL
Hgb urine dipstick: NEGATIVE
Ketones, ur: NEGATIVE mg/dL
Leukocytes,Ua: NEGATIVE
Nitrite: NEGATIVE
Protein, ur: NEGATIVE mg/dL
Specific Gravity, Urine: 1.006 (ref 1.005–1.030)
pH: 6 (ref 5.0–8.0)

## 2019-03-16 LAB — COMPREHENSIVE METABOLIC PANEL
ALBUMIN: 4.4 g/dL (ref 3.5–5.0)
ALT: 29 U/L (ref 0–44)
AST: 32 U/L (ref 15–41)
Alkaline Phosphatase: 63 U/L (ref 38–126)
Anion gap: 12 (ref 5–15)
BUN: 11 mg/dL (ref 8–23)
CHLORIDE: 102 mmol/L (ref 98–111)
CO2: 24 mmol/L (ref 22–32)
Calcium: 9.4 mg/dL (ref 8.9–10.3)
Creatinine, Ser: 0.73 mg/dL (ref 0.44–1.00)
GFR calc Af Amer: >60 mL/min (ref >60)
GFR calc non Af Amer: >60 mL/min (ref >60)
Glucose, Bld: 100 mg/dL — ABNORMAL HIGH (ref 70–99)
Potassium: 3.6 mmol/L (ref 3.5–5.1)
SODIUM: 138 mmol/L (ref 135–145)
Total Bilirubin: 0.8 mg/dL (ref 0.3–1.2)
Total Protein: 7.3 g/dL (ref 6.5–8.1)

## 2019-03-16 LAB — CBG MONITORING, ED: Glucose-Capillary: 93 mg/dL (ref 70–99)

## 2019-03-16 LAB — CBC WITH DIFFERENTIAL/PLATELET
Abs Immature Granulocytes: 0.02 10*3/uL (ref 0.00–0.07)
Basophils Absolute: 0 10*3/uL (ref 0.0–0.1)
Basophils Relative: 0 %
Eosinophils Absolute: 0.1 10*3/uL (ref 0.0–0.5)
Eosinophils Relative: 1 %
HCT: 35.8 % — ABNORMAL LOW (ref 36.0–46.0)
Hemoglobin: 11.6 g/dL — ABNORMAL LOW (ref 12.0–15.0)
Immature Granulocytes: 0 %
Lymphocytes Relative: 29 %
Lymphs Abs: 1.5 10*3/uL (ref 0.7–4.0)
MCH: 34 pg (ref 26.0–34.0)
MCHC: 32.4 g/dL (ref 30.0–36.0)
MCV: 105 fL — ABNORMAL HIGH (ref 80.0–100.0)
Monocytes Absolute: 0.6 10*3/uL (ref 0.1–1.0)
Monocytes Relative: 11 %
Neutro Abs: 3 10*3/uL (ref 1.7–7.7)
Neutrophils Relative %: 59 %
PLATELETS: 288 10*3/uL (ref 150–400)
RBC: 3.41 MIL/uL — ABNORMAL LOW (ref 3.87–5.11)
RDW: 14 % (ref 11.5–15.5)
WBC: 5.2 10*3/uL (ref 4.0–10.5)
nRBC: 0 % (ref 0.0–0.2)

## 2019-03-16 LAB — BRAIN NATRIURETIC PEPTIDE: B Natriuretic Peptide: 70 pg/mL (ref 0.0–100.0)

## 2019-03-16 LAB — TROPONIN I: Troponin I: 0.03 ng/mL (ref <0.03)

## 2019-03-16 LAB — D-DIMER, QUANTITATIVE: D-Dimer, Quant: 0.36 ug/mL-FEU (ref 0.00–0.50)

## 2019-03-16 LAB — LIPASE, BLOOD: LIPASE: 21 U/L (ref 11–51)

## 2019-03-16 MED ORDER — ACETAMINOPHEN 325 MG PO TABS
650.0000 mg | ORAL_TABLET | Freq: Once | ORAL | Status: AC
  Administered 2019-03-16: 650 mg via ORAL
  Filled 2019-03-16: qty 2

## 2019-03-16 MED ORDER — SODIUM CHLORIDE 0.9 % IV BOLUS
500.0000 mL | Freq: Once | INTRAVENOUS | Status: AC
  Administered 2019-03-16: 500 mL via INTRAVENOUS

## 2019-03-16 NOTE — ED Notes (Signed)
Patient transported to CT 

## 2019-03-16 NOTE — ED Provider Notes (Addendum)
[facility] EMERGENCY DEPARTMENT Provider Note   CSN: [id number] Arrival date & time: 03/16/19  1235    History   Chief Complaint Chief Complaint  Patient presents with   Shortness of Breath    HPI [name] Rojas is a 78 y.o. female.     HPI  78 year old female presents with a chief complaint of generalized weakness.  She is been feeling this way for about 3 days but got worse yesterday.  She is had a slight cough that is nonproductive.  She has had shortness of breath when walking.  Has also had intermittent sweating.  No fevers noted.  Has had a headache that is a little stronger than typical but she also has chronic headaches.  Chronic back pain but is also having some new back pain and some abdominal pain.  The abdominal pain is most prominent when she is having diarrhea just before a bowel movement.  She is been having diarrhea over these last 3 days.  No vomiting.  No chest pain.  No known contact with anyone with COVID-19.  Past Medical History:  Diagnosis Date   Anemia    Anxiety    Arthritis    Chronic back pain    Depression    Fibromyalgia    GERD (gastroesophageal reflux disease)    Hypertension    Osteoporosis    Stroke [facility])     Patient Active Problem List   Diagnosis Date Noted   Gait instability    Vertigo    Dizziness 06/10/2018   CVA (cerebral vascular accident) (HCC) 06/10/2018   Major depressive disorder, recurrent episode, mild with anxious distress (HCC) 09/25/2017   Mild neurocognitive disorder 07/12/2017   Vitamin D deficiency 06/27/2017   Osteoporosis 06/27/2017   Fibromyalgia 03/30/2017   History of medication noncompliance 03/30/2017   Osteoporosis of lower leg associated with endocrine disorder 03/30/2017   Primary osteoarthritis of left shoulder 04/11/2016   Hearing difficulty 09/17/2015   Neuropathic pain 04/10/2013   Anxiety disorder 04/10/2013   Failed back syndrome 08/24/2012   Pulmonary hypertension  (HCC) 04/25/2012   Congenital heart disease with intracardiac shunting 04/25/2012   History of stroke 05/23/2011   Status post total knee replacement 02/03/2010   GERD (gastroesophageal reflux disease) 02/03/2010   Chronic pain 02/02/2010   Essential hypertension 01/15/2010   Status post bariatric surgery 04/30/2009    Past Surgical History:  Procedure Laterality Date   ABDOMINAL HYSTERECTOMY     bleeding   BACK SURGERY     GASTRIC BYPASS     KNEE SURGERY     TONSILLECTOMY       OB History   No obstetric history on file.      Home Medications    Prior to Admission medications   Medication Sig Start Date End Date Taking? Authorizing Provider  amLODipine (NORVASC) 5 MG tablet Take 5 mg by mouth daily.   Yes [provider]  calcium-vitamin D (OSCAL WITH D) 500-200 MG-UNIT tablet Take 1 tablet by mouth 2 (two) times a week.    Yes [provider]  cholecalciferol (VITAMIN D) 1000 units tablet Take 1,000 Units by mouth daily.   Yes [provider]  clonazePAM (KLONOPIN) 0.5 MG tablet Take 0.5 mg by mouth at bedtime as needed. 12/28/18  Yes [provider]  hydrochlorothiazide (HYDRODIURIL) 50 MG tablet Take 50 mg by mouth daily.   Yes [provider]  lidocaine (LIDODERM) 5 % Place 1 patch onto the skin daily. 03/04/19  Yes [provider]  loratadine (CLARITIN) 10 MG tablet Take 10 mg by mouth daily as needed for allergies.    Yes [provider]  meloxicam (MOBIC) 15 MG tablet Take 15 mg by mouth daily.   Yes [provider]  MYRBETRIQ 25 MG TB24 tablet Take 1 tablet by mouth daily. 02/22/19  Yes [provider]  omeprazole (PRILOSEC) 40 MG capsule Take 1 capsule by mouth daily. 02/17/19  Yes [provider]  oxyCODONE-acetaminophen (PERCOCET) 7.5-325 MG tablet Take 1 tablet by mouth every 6 (six) hours as needed for moderate pain or severe pain.   Yes [provider]   pregabalin (LYRICA) 75 MG capsule Take 1 capsule by mouth 3 (three) times daily. 02/14/19  Yes [provider]  tiZANidine (ZANAFLEX) 2 MG tablet Take 4 mg by mouth at bedtime.  10/26/18  Yes [provider]  escitalopram (LEXAPRO) 10 MG tablet Take 1 tablet (10 mg total) by mouth daily. Patient not taking: Reported on 02/26/2019 10/10/18   [name] Hotter, MD  hydrochlorothiazide (HYDRODIURIL) 25 MG tablet Take 1 tablet (25 mg total) by mouth daily. Patient not taking: Reported on 03/16/2019 08/03/17   [name] Moore, MD  methocarbamol (ROBAXIN) 500 MG tablet Take 1 tablet (500 mg total) by mouth 2 (two) times daily as needed for muscle spasms. Patient not taking: Reported on 03/16/2019 12/22/18   Albrizze, Yvonna Alanis E, PA-C  naproxen (NAPROSYN) 500 MG tablet Take 1 tablet (500 mg total) by mouth 2 (two) times daily. Patient not taking: Reported on 02/26/2019 03/06/18   [name] Born, PA-C  potassium chloride (KLOR-CON M10) 10 MEQ tablet Take 1 tablet (10 mEq total) by mouth daily. Patient not taking: Reported on 02/26/2019 08/03/17   [name] Moore, MD    Family History Family History  Problem Relation Age of Onset   Arthritis Mother    Asthma Mother    Heart disease Mother    Early death Father        trauma   Heart disease Sister    Stroke Sister     Social History Social History   Tobacco Use   Smoking status: Never Smoker   Smokeless tobacco: Never Used  Substance Use Topics   Alcohol use: No   Drug use: No     Allergies   Codeine; Iodinated diagnostic agents; Lyrica [pregabalin]; Statins; and Tramadol   Review of Systems Review of Systems  Constitutional: Negative for fever.  Respiratory: Positive for cough and shortness of breath.   Cardiovascular: Positive for leg swelling (transient, recently, bilateral ankles). Negative for chest pain.  Gastrointestinal: Positive for abdominal pain and diarrhea. Negative for vomiting.   Genitourinary: Positive for dysuria (on and off).  Musculoskeletal: Positive for back pain.  Neurological: Positive for weakness and headaches.  All other systems reviewed and are negative.    Physical Exam Updated Vital Signs BP (!) 139/101    Pulse 99    Temp 98.6 F (37 C) (Oral)    Resp 17    Ht 5\' 4"  (1.626 m)    Wt 81.6 kg    SpO2 95%    BMI 30.90 kg/m   Physical Exam Vitals signs and nursing note reviewed.  Constitutional:      Appearance: She is well-developed.  HENT:     Head: Normocephalic and atraumatic.     Right Ear: External ear normal.     Left Ear: External ear normal.     Nose: Nose normal.  Eyes:     General:        Right eye: No discharge.        Left eye: No discharge.  Cardiovascular:     Rate and Rhythm: Normal rate. Rhythm irregular.     Heart sounds: Normal heart sounds.  Pulmonary:     Effort: Pulmonary effort is normal.     Breath sounds: Normal breath sounds. No decreased breath sounds, wheezing, rhonchi or rales.  Abdominal:     Palpations: Abdomen is soft.     Tenderness: There is abdominal tenderness (mild, generalized).  Musculoskeletal:     Right lower leg: No edema.     Left lower leg: No edema.     Comments: Diffuse thoracic and lumbar back tenderness  Skin:    General: Skin is warm and dry.  Neurological:     Mental Status: She is alert.     Comments: 5/5 strength in all 4 extremities. No facial droop or slurred speech  Psychiatric:        Mood and Affect: Mood is not anxious.      ED Treatments / Results  Labs (all labs ordered are listed, but only abnormal results are displayed) Labs Reviewed  COMPREHENSIVE METABOLIC PANEL - Abnormal; Notable for the following components:      Result Value   Glucose, Bld 100 (*)    All other components within normal limits  TROPONIN I - Abnormal; Notable for the following components:   Troponin I 0.03 (*)    All other components within normal limits  CBC WITH DIFFERENTIAL/PLATELET -  Abnormal; Notable for the following components:   RBC 3.41 (*)    Hemoglobin 11.6 (*)    HCT 35.8 (*)    MCV 105.0 (*)    All other components within normal limits  URINALYSIS, ROUTINE W REFLEX MICROSCOPIC - Abnormal; Notable for the following components:   APPearance HAZY (*)    All other components within normal limits  URINE CULTURE  BRAIN NATRIURETIC PEPTIDE  D-DIMER, QUANTITATIVE (NOT AT [facility])  LIPASE, BLOOD  CBG MONITORING, ED    EKG EKG Interpretation  Date/Time:  Saturday March 16 2019 12:46:41 EDT Ventricular Rate:  92 PR Interval:    QRS Duration: 84 QT Interval:  351 QTC Calculation: 435 R Axis:   -16 Text Interpretation:  Sinus tachycardia Multiform ventricular premature complexes Abnormal R-wave progression, early transition Left ventricular hypertrophy Confirmed by Pricilla Loveless [phone]) on 03/16/2019 1:01:16 PM   Radiology Ct Abdomen Pelvis Wo Contrast  Result Date: 03/16/2019 CLINICAL DATA:  Abdominal pain. Diverticulitis suspected. Prior gastric bypass EXAM: CT ABDOMEN AND PELVIS WITHOUT CONTRAST TECHNIQUE: Multidetector CT imaging of the abdomen and pelvis was performed following the standard protocol without IV contrast. COMPARISON:  Today's chest radiograph.  No prior CTs. FINDINGS: Lower chest: Clear lung bases. Normal heart size without pericardial or pleural effusion. Hepatobiliary: Mild hepatic steatosis. Cholecystectomy, without biliary ductal dilatation. Pancreas: Probable pancreatic atrophy. Spleen: Normal in size, without focal abnormality. Adrenals/Urinary Tract: Normal adrenal glands. Punctate interpolar left renal collecting system calculus. No hydroureter or ureteric calculi. No bladder calculi. Stomach/Bowel: Status post Roux-en-Y gastric bypass. Normal noncontrast appearance of the colon and terminal ileum. Appendix not visualized. Otherwise normal small bowel given noncontrast technique. Vascular/Lymphatic: Aortic atherosclerosis. No abdominopelvic  adenopathy. Reproductive: Hysterectomy.  No adnexal mass. Other: No significant free fluid. No gross free intraperitoneal air. Musculoskeletal: Sclerosis about both sacroiliac joints is likely degenerative. L4-S1 trans pedicle screw fixation. IMPRESSION: 1. Left nephrolithiasis, without  obstructive uropathy. 2. Otherwise, limited exam secondary to lack of oral or IV contrast. In addition, there is beam hardening artifact from lumbosacral spine hardware. 3.  Aortic Atherosclerosis (ICD10-I70.0). 4. Mild hepatic steatosis. Electronically Signed   By: Jeronimo GreavesKyle  Talbot M.D.   On: 03/16/2019 14:39   Dg Chest Portable 1 View  Result Date: 03/16/2019 CLINICAL DATA:  Cough and shortness of breath for 3 days. EXAM: PORTABLE CHEST 1 VIEW COMPARISON:  June 10, 2018 FINDINGS: The heart size and mediastinal contours are within normal limits. Both lungs are clear. The visualized skeletal structures are unremarkable. IMPRESSION: No active disease. Electronically Signed   By: Gerome Samavid  Williams III M.D   On: 03/16/2019 13:50    Procedures Procedures (including critical care time)  Medications Ordered in ED Medications  sodium chloride 0.9 % bolus 500 mL (0 mLs Intravenous Stopped 03/16/19 1430)  acetaminophen (TYLENOL) tablet 650 mg (650 mg Oral Given 03/16/19 1407)     Initial Impression / Assessment and Plan / ED Course  I have reviewed the triage vital signs and the nursing notes.  Pertinent labs & imaging results that were available during my care of the patient were reviewed by me and considered in my medical decision making (see chart for details).        Patient has multiple nonspecific symptoms.  Most likely a viral illness.  In the current COVID pandemic, I recommended that she self quarantine as she is not currently ill enough to require admission.  Otherwise given the mild abdominal tenderness with diarrhea, CT was obtained and is benign.  Labs are overall reassuring.  She has a troponin of 0.03 but has  had this before and in this setting I have low suspicion this is ACS.  She has had overall feeling poor for a couple days and I would expect troponin to be higher than ACS.  She was able to ambulate without inducing any dyspnea and did not have any hypoxia.  Low suspicion for PE.  She appears stable for discharge home with return precautions.  [name] Rojas was evaluated in Emergency Department on 03/16/2019 for the symptoms described in the history of present illness. She was evaluated in the context of the global COVID-19 pandemic, which necessitated consideration that the patient might be at risk for infection with the SARS-CoV-2 virus that causes COVID-19. Institutional protocols and algorithms that pertain to the evaluation of patients at risk for COVID-19 are in a state of rapid change based on information released by regulatory bodies including the CDC and federal and state organizations. These policies and algorithms were followed during the patient's care in the ED.   Final Clinical Impressions(s) / ED Diagnoses   Final diagnoses:  Generalized weakness  Viral illness    ED Discharge Orders    None       Pricilla LovelessGoldston, Mildred Bollard, MD 03/16/19 1554    Pricilla LovelessGoldston, Aubry Rankin, MD 03/16/19 [phone]

## 2019-03-16 NOTE — Discharge Instructions (Signed)
If you develop chest pain, shortness of breath, fever, vomiting, or any other new/concerning symptoms then return to the ER for evaluation.  Given the current pandemic, with your cough and shortness of breath, we are asking you to self quarantine over the next 14 days.  If you develop shortness of breath or feel much worse than you should return to the ER for evaluation.     Person Under Monitoring Name: [name] Rojas  Location: [address] [place] [address]   Infection Prevention Recommendations for Individuals Confirmed to have, or Being Evaluated for, 2019 Novel Coronavirus (COVID-19) Infection Who Receive Care at Home  Individuals who are confirmed to have, or are being evaluated for, COVID-19 should follow the prevention steps below until a healthcare provider or local or state health department says they can return to normal activities.  Stay home except to get medical care You should restrict activities outside your home, except for getting medical care. Do not go to work, school, or public areas, and do not use public transportation or taxis.  Call ahead before visiting your doctor Before your medical appointment, call the healthcare provider and tell them that you have, or are being evaluated for, COVID-19 infection. This will help the healthcare providers office take steps to keep other people from getting infected. Ask your healthcare provider to call the local or state health department.  Monitor your symptoms Seek prompt medical attention if your illness is worsening (e.g., difficulty breathing). Before going to your medical appointment, call the healthcare provider and tell them that you have, or are being evaluated for, COVID-19 infection. Ask your healthcare provider to call the local or state health department.  Wear a facemask You should wear a facemask that covers your nose and mouth when you are in the same room with other people and when you visit  a healthcare provider. People who live with or visit you should also wear a facemask while they are in the same room with you.  Separate yourself from other people in your home As much as possible, you should stay in a different room from other people in your home. Also, you should use a separate bathroom, if available.  Avoid sharing household items You should not share dishes, drinking glasses, cups, eating utensils, towels, bedding, or other items with other people in your home. After using these items, you should wash them thoroughly with soap and water.  Cover your coughs and sneezes Cover your mouth and nose with a tissue when you cough or sneeze, or you can cough or sneeze into your sleeve. Throw used tissues in a lined trash can, and immediately wash your hands with soap and water for at least 20 seconds or use an alcohol-based hand rub.  Wash your [employer] your hands often and thoroughly with soap and water for at least 20 seconds. You can use an alcohol-based hand sanitizer if soap and water are not available and if your hands are not visibly dirty. Avoid touching your eyes, nose, and mouth with unwashed hands.   Prevention Steps for Caregivers and Household Members of Individuals Confirmed to have, or Being Evaluated for, COVID-19 Infection Being Cared for in the Home  If you live with, or provide care at home for, a person confirmed to have, or being evaluated for, COVID-19 infection please follow these guidelines to prevent infection:  Follow healthcare providers instructions Make sure that you understand and can help the patient follow any healthcare provider instructions  for all care.  Provide for the patients basic needs You should help the patient with basic needs in the home and provide support for getting groceries, prescriptions, and other personal needs.  Monitor the patients symptoms If they are getting sicker, call his or her medical provider and tell  them that the patient has, or is being evaluated for, COVID-19 infection. This will help the healthcare providers office take steps to keep other people from getting infected. Ask the healthcare provider to call the local or state health department.  Limit the number of people who have contact with the patient If possible, have only one caregiver for the patient. Other household members should stay in another home or place of residence. If this is not possible, they should stay in another room, or be separated from the patient as much as possible. Use a separate bathroom, if available. Restrict visitors who do not have an essential need to be in the home.  Keep older adults, very young children, and other sick people away from the patient Keep older adults, very young children, and those who have compromised immune systems or chronic health conditions away from the patient. This includes people with chronic heart, lung, or kidney conditions, diabetes, and cancer.  Ensure good ventilation Make sure that shared spaces in the home have good air flow, such as from an air conditioner or an opened window, weather permitting.  Wash your hands often Wash your hands often and thoroughly with soap and water for at least 20 seconds. You can use an alcohol based hand sanitizer if soap and water are not available and if your hands are not visibly dirty. Avoid touching your eyes, nose, and mouth with unwashed hands. Use disposable paper towels to dry your hands. If not available, use dedicated cloth towels and replace them when they become wet.  Wear a facemask and gloves Wear a disposable facemask at all times in the room and gloves when you touch or have contact with the patients blood, body fluids, and/or secretions or excretions, such as sweat, saliva, sputum, nasal mucus, vomit, urine, or feces.  Ensure the mask fits over your nose and mouth tightly, and do not touch it during use. Throw out  disposable facemasks and gloves after using them. Do not reuse. Wash your hands immediately after removing your facemask and gloves. If your personal clothing becomes contaminated, carefully remove clothing and launder. Wash your hands after handling contaminated clothing. Place all used disposable facemasks, gloves, and other waste in a lined container before disposing them with other household waste. Remove gloves and wash your hands immediately after handling these items.  Do not share dishes, glasses, or other household items with the patient Avoid sharing household items. You should not share dishes, drinking glasses, cups, eating utensils, towels, bedding, or other items with a patient who is confirmed to have, or being evaluated for, COVID-19 infection. After the person uses these items, you should wash them thoroughly with soap and water.  Wash laundry thoroughly Immediately remove and wash clothes or bedding that have blood, body fluids, and/or secretions or excretions, such as sweat, saliva, sputum, nasal mucus, vomit, urine, or feces, on them. Wear gloves when handling laundry from the patient. Read and follow directions on labels of laundry or clothing items and detergent. In general, wash and dry with the warmest temperatures recommended on the label.  Clean all areas the individual has used often Clean all touchable surfaces, such as counters, tabletops, doorknobs, bathroom fixtures,  toilets, phones, keyboards, tablets, and bedside tables, every day. Also, clean any surfaces that may have blood, body fluids, and/or secretions or excretions on them. Wear gloves when cleaning surfaces the patient has come in contact with. Use a diluted bleach solution (e.g., dilute bleach with 1 part bleach and 10 parts water) or a household disinfectant with a label that says EPA-registered for coronaviruses. To make a bleach solution at home, add 1 tablespoon of bleach to 1 quart (4 cups) of water.  For a larger supply, add  cup of bleach to 1 gallon (16 cups) of water. Read labels of cleaning products and follow recommendations provided on product labels. Labels contain instructions for safe and effective use of the cleaning product including precautions you should take when applying the product, such as wearing gloves or eye protection and making sure you have good ventilation during use of the product. Remove gloves and wash hands immediately after cleaning.  Monitor yourself for signs and symptoms of illness Caregivers and household members are considered close contacts, should monitor their health, and will be asked to limit movement outside of the home to the extent possible. Follow the monitoring steps for close contacts listed on the symptom monitoring form.   ? If you have additional questions, contact your local health department or call the epidemiologist on call at [phone] (available 24/7). ? This guidance is subject to change. For the most up-to-date guidance from [facility], please refer to their website: [email or web address]

## 2019-03-16 NOTE — ED Triage Notes (Signed)
Pt states for the past 3 days she has been feeling bad.  States she has had a cough, some shortness of breath, and breaking out in sweats intermittently.  Has not been around anyone else sick that she is aware of.

## 2019-03-16 NOTE — ED Notes (Signed)
Pt ambulated in hallway on room air.  Oxygen 95%.  Denies any complaints while ambulating

## 2019-03-16 NOTE — ED Notes (Signed)
Date and time results received: 03/16/19 1406 (use smartphrase ".now" to insert current time)  Test: troponin  Critical Value: 0.03  Name of Provider Notified: Dr. Criss Alvine   Orders Received? Or Actions Taken?: n/a

## 2019-03-18 ENCOUNTER — Encounter (HOSPITAL_COMMUNITY): Payer: Self-pay | Admitting: Emergency Medicine

## 2019-03-18 ENCOUNTER — Emergency Department (HOSPITAL_COMMUNITY)
Admission: EM | Admit: 2019-03-18 | Discharge: 2019-03-18 | Disposition: A | Discharge status: Home / Self Care | Payer: Medicare Other | Attending: Emergency Medicine | Admitting: Emergency Medicine

## 2019-03-18 ENCOUNTER — Other Ambulatory Visit: Payer: Self-pay

## 2019-03-18 DIAGNOSIS — R5381 Other malaise: Secondary | ICD-10-CM | POA: Insufficient documentation

## 2019-03-18 DIAGNOSIS — I10 Essential (primary) hypertension: Secondary | ICD-10-CM | POA: Diagnosis not present

## 2019-03-18 DIAGNOSIS — F4321 Adjustment disorder with depressed mood: Secondary | ICD-10-CM | POA: Diagnosis not present

## 2019-03-18 DIAGNOSIS — Z79899 Other long term (current) drug therapy: Secondary | ICD-10-CM | POA: Insufficient documentation

## 2019-03-18 DIAGNOSIS — R531 Weakness: Secondary | ICD-10-CM | POA: Diagnosis present

## 2019-03-18 LAB — URINE CULTURE

## 2019-03-18 NOTE — Discharge Instructions (Signed)
Your symptoms as well as a test that were done yesterday are reassuring.  It does not appear that you have the coronavirus.  It is safe to go home and continue your usual activities.  Watch for signs of illness including fever, trouble breathing, weakness or dizziness.  Some of your symptoms may be related to a grief reaction.  Sure that you are reaching out and talking to family, friends, and Ambulance person.  Work on eating, drinking and trying to get good sleep every night.  You can always return here if needed for problems.

## 2019-03-18 NOTE — ED Triage Notes (Signed)
Patient seen by PCP yesterday for flu like symptoms. Patient reports weakness and sweating. Patient reports symptoms started 3 days ago.

## 2019-03-18 NOTE — ED Triage Notes (Signed)
Patient states she lost her Mom the first part of last week.

## 2019-03-18 NOTE — ED Provider Notes (Signed)
[facility] EMERGENCY DEPARTMENT Provider Note   CSN: [id number] Arrival date & time: 03/18/19  1216    History   Chief Complaint Chief Complaint  Patient presents with  . Weakness    HPI [name] Rojas is a 78 y.o. female.     HPI   She presents for general weakness, with fatigue, difficulty sleeping, and intermittent abdominal pain with "a knot there when I move."  She denies nausea, vomiting, fever, chills, cough, shortness of breath or chest pain.  He was comprehensively evaluated yesterday for similar complaints, had chest x-ray, CT abdomen, blood work and urine.  Evaluation was reassuring.  Consideration was given for Covid-19, and she was instructed to return for concerning symptoms.  The patient decided come back for the same symptoms, but has not developed symptoms concerning for covert including fever, chills, cough, or loss of smell.  She continues to be able to eat.  She is also concerned about dust and dirt in her house, that she states "is 78 years old."  She appears to be concerned about environmental exposures causing her to feel bad.  There are no other known modifying factors.  Past Medical History:  Diagnosis Date  . Anemia   . Anxiety   . Arthritis   . Chronic back pain   . Depression   . Fibromyalgia   . GERD (gastroesophageal reflux disease)   . Hypertension   . Osteoporosis   . Stroke [facility])     Patient Active Problem List   Diagnosis Date Noted  . Gait instability   . Vertigo   . Dizziness 06/10/2018  . CVA (cerebral vascular accident) (HCC) 06/10/2018  . Major depressive disorder, recurrent episode, mild with anxious distress (HCC) 09/25/2017  . Mild neurocognitive disorder 07/12/2017  . Vitamin D deficiency 06/27/2017  . Osteoporosis 06/27/2017  . Fibromyalgia 03/30/2017  . History of medication noncompliance 03/30/2017  . Osteoporosis of lower leg associated with endocrine disorder 03/30/2017  . Primary osteoarthritis of left shoulder  04/11/2016  . Hearing difficulty 09/17/2015  . Neuropathic pain 04/10/2013  . Anxiety disorder 04/10/2013  . Failed back syndrome 08/24/2012  . Pulmonary hypertension (HCC) 04/25/2012  . Congenital heart disease with intracardiac shunting 04/25/2012  . History of stroke 05/23/2011  . Status post total knee replacement 02/03/2010  . GERD (gastroesophageal reflux disease) 02/03/2010  . Chronic pain 02/02/2010  . Essential hypertension 01/15/2010  . Status post bariatric surgery 04/30/2009    Past Surgical History:  Procedure Laterality Date  . ABDOMINAL HYSTERECTOMY     bleeding  . BACK SURGERY    . GASTRIC BYPASS    . KNEE SURGERY    . TONSILLECTOMY       OB History    Gravida  7   Para  7   Term  7   Preterm      AB      Living        SAB      TAB      Ectopic      Multiple      Live Births               Home Medications    Prior to Admission medications   Medication Sig Start Date End Date Taking? Authorizing Provider  amLODipine (NORVASC) 5 MG tablet Take 5 mg by mouth daily.    [provider]  calcium-vitamin D (OSCAL WITH D) 500-200 MG-UNIT tablet Take 1 tablet by mouth 2 (two) times a  week.     [provider]  cholecalciferol (VITAMIN D) 1000 units tablet Take 1,000 Units by mouth daily.    [provider]  clonazePAM (KLONOPIN) 0.5 MG tablet Take 0.5 mg by mouth at bedtime as needed. 12/28/18   [provider]  escitalopram (LEXAPRO) 10 MG tablet Take 1 tablet (10 mg total) by mouth daily. Patient not taking: Reported on 02/26/2019 10/10/18   Neysa Hotter, MD  hydrochlorothiazide (HYDRODIURIL) 25 MG tablet Take 1 tablet (25 mg total) by mouth daily. Patient not taking: Reported on 03/16/2019 08/03/17   Eustace Moore, MD  hydrochlorothiazide (HYDRODIURIL) 50 MG tablet Take 50 mg by mouth daily.    [provider]  lidocaine (LIDODERM) 5 % Place 1 patch onto the skin daily. 03/04/19   [provider]  loratadine (CLARITIN) 10 MG tablet Take 10 mg by mouth daily as needed for allergies.     [provider]  meloxicam (MOBIC) 15 MG tablet Take 15 mg by mouth daily.    [provider]  methocarbamol (ROBAXIN) 500 MG tablet Take 1 tablet (500 mg total) by mouth 2 (two) times daily as needed for muscle spasms. Patient not taking: Reported on 03/16/2019 12/22/18   Albrizze, Kaitlyn E, PA-C  MYRBETRIQ 25 MG TB24 tablet Take 1 tablet by mouth daily. 02/22/19   [provider]  naproxen (NAPROSYN) 500 MG tablet Take 1 tablet (500 mg total) by mouth 2 (two) times daily. Patient not taking: Reported on 02/26/2019 03/06/18   Bethel Born, PA-C  omeprazole (PRILOSEC) 40 MG capsule Take 1 capsule by mouth daily. 02/17/19   [provider]  oxyCODONE-acetaminophen (PERCOCET) 7.5-325 MG tablet Take 1 tablet by mouth every 6 (six) hours as needed for moderate pain or severe pain.    [provider]  potassium chloride (KLOR-CON M10) 10 MEQ tablet Take 1 tablet (10 mEq total) by mouth daily. Patient not taking: Reported on 02/26/2019 08/03/17   Eustace Moore, MD  pregabalin (LYRICA) 75 MG capsule Take 1 capsule by mouth 3 (three) times daily. 02/14/19   [provider]  tiZANidine (ZANAFLEX) 2 MG tablet Take 4 mg by mouth at bedtime.  10/26/18   [provider]    Family History Family History  Problem Relation Age of Onset  . Arthritis Mother   . Asthma Mother   . Heart disease Mother   . Early death Father        trauma  . Heart disease Sister   . Stroke Sister     Social History Social History   Tobacco Use  . Smoking status: Never Smoker  . Smokeless tobacco: Never Used  Substance Use Topics  . Alcohol use: No  . Drug use: No     Allergies   Codeine; Iodinated diagnostic agents; Lyrica [pregabalin]; Statins; and Tramadol   Review of Systems Review of Systems  All other systems reviewed and are negative.     Physical Exam Updated Vital Signs BP (!) 148/101 (BP Location: Right Arm)   Pulse 99   Temp 98 F (36.7 C) (Oral)   Resp 16   Ht  (1.626 m)   Wt 81.6 kg   SpO2 97%   BMI 30.90 kg/m   Physical Exam Vitals signs and nursing note reviewed.  Constitutional:      General: She is not in acute distress.    Appearance: Normal appearance. She is well-developed. She is not ill-appearing, toxic-appearing or diaphoretic.  HENT:     Head: Normocephalic and atraumatic.     Right Ear: External ear normal.     Left Ear: External ear normal.  Eyes:     Conjunctiva/sclera: Conjunctivae normal.     Pupils: Pupils are equal, round, and reactive to light.  Neck:     Musculoskeletal: Normal range of motion and neck supple.     Trachea: Phonation normal.  Cardiovascular:     Rate and Rhythm: Normal rate and regular rhythm.     Heart sounds: Normal heart sounds.  Pulmonary:     Effort: Pulmonary effort is normal.     Breath sounds: Normal breath sounds.  Abdominal:     General: There is no distension.     Palpations: Abdomen is soft.     Tenderness: There is no abdominal tenderness.     Comments: Midline vertical incision from prior surgical procedure.  Questionable reducible left upper quadrant hernia, indistinct, examination somewhat limited by mild obesity.  There is no tenderness with palpation of the abdomen.  Musculoskeletal: Normal range of motion.  Skin:    General: Skin is warm and dry.  Neurological:     Mental Status: She is alert and oriented to person, place, and time.     Cranial Nerves: No cranial nerve deficit.     Sensory: No sensory deficit.     Motor: No abnormal muscle tone.     Coordination: Coordination normal.  Psychiatric:        Mood and Affect: Mood normal.        Behavior: Behavior normal.        Thought Content: Thought content normal.        Judgment: Judgment normal.      ED Treatments / Results  Labs (all labs ordered are listed, but only  abnormal results are displayed) Labs Reviewed - No data to display  EKG None  Radiology No results found.  Procedures Procedures (including critical care time)  Medications Ordered in ED Medications - No data to display   Initial Impression / Assessment and Plan / ED Course  I have reviewed the triage vital signs and the nursing notes.  Pertinent labs & imaging results that were available during my care of the patient were reviewed by me and considered in my medical decision making (see chart for details).         Patient Vitals for the past 24 hrs:  BP Temp Temp src Pulse Resp SpO2 Height Weight  03/18/19 1254 (!) 148/101 98 F (36.7 C) Oral 99 16 97 % - -  03/18/19 1253 - - - - - - 5\' 4"  (1.626 m) 81.6 kg    3:23 PM Reevaluation with update and discussion. After initial assessment and treatment, an updated evaluation reveals no change in clinical status.  I had a discussion with the patient regarding the death of her mother, 3 weeks ago.  She states that she is upset about that, and sometimes cries and feels sad.  She is somewhat detached from family members because of the current pandemic, does not talk to her minister recently.  Findings were discussed with the patient and all questions were answered. Mancel Bale   Medical Decision Making: Toma Deiters, with nonspecific symptoms.  Patient without signs and symptoms for cute viral process.  Also is bacterial infection, metabolic instability or impending vascular collapse.  No indication for further ED treatment or hospitalization at this time.    Vonda Kilgour was evaluated in Emergency Department  on 03/18/2019 for the symptoms described in the history of present illness. She was evaluated in the context of the global COVID-19 pandemic, which necessitated consideration that the patient might be at risk for infection with the SARS-CoV-2 virus that causes COVID-19. Institutional protocols and algorithms that pertain to the evaluation  of patients at risk for COVID-19 are in a state of rapid change based on information released by regulatory bodies including the CDC and federal and state organizations. These policies and algorithms were followed during the patient's care in the ED.  CRITICAL CARE-no Performed by: Mancel Bale  Nursing Notes Reviewed/ Care Coordinated Applicable Imaging Reviewed Interpretation of Laboratory Data incorporated into ED treatment  The patient appears reasonably screened and/or stabilized for discharge and I doubt any other medical condition or other [facility] requiring further screening, evaluation, or treatment in the ED at this time prior to discharge.  Plan: Home Medications-continue usual; Home Treatments-rest, fluids; return here if the recommended treatment, does not improve the symptoms; Recommended follow up-PCP, PRN   Final Clinical Impressions(s) / ED Diagnoses   Final diagnoses:  Malaise  Grief reaction    ED Discharge Orders    None       Mancel Bale, MD 03/18/19 [phone]

## 2019-03-18 NOTE — ED Notes (Signed)
Pt ambulated from triage to room 9 without difficultly

## 2019-03-19 DIAGNOSIS — R0602 Shortness of breath: Secondary | ICD-10-CM | POA: Diagnosis not present

## 2019-03-19 DIAGNOSIS — R0982 Postnasal drip: Secondary | ICD-10-CM | POA: Diagnosis not present

## 2019-03-19 DIAGNOSIS — J309 Allergic rhinitis, unspecified: Secondary | ICD-10-CM | POA: Diagnosis not present

## 2019-03-19 DIAGNOSIS — M2550 Pain in unspecified joint: Secondary | ICD-10-CM | POA: Diagnosis not present

## 2019-03-26 ENCOUNTER — Other Ambulatory Visit (HOSPITAL_COMMUNITY): Payer: Self-pay

## 2019-04-01 ENCOUNTER — Ambulatory Visit: Admit: 2019-04-01 | Payer: Medicare Other | Admitting: Ophthalmology

## 2019-04-01 SURGERY — PHACOEMULSIFICATION, CATARACT, WITH IOL INSERTION
Anesthesia: Monitor Anesthesia Care | Laterality: Left

## 2019-04-19 ENCOUNTER — Emergency Department (HOSPITAL_COMMUNITY): Payer: Medicare Other

## 2019-04-19 ENCOUNTER — Emergency Department (HOSPITAL_COMMUNITY)
Admission: EM | Admit: 2019-04-19 | Discharge: 2019-04-19 | Disposition: A | Discharge status: Home / Self Care | Payer: Medicare Other | Attending: Emergency Medicine | Admitting: Emergency Medicine

## 2019-04-19 ENCOUNTER — Other Ambulatory Visit: Payer: Self-pay

## 2019-04-19 ENCOUNTER — Encounter (HOSPITAL_COMMUNITY): Payer: Self-pay

## 2019-04-19 DIAGNOSIS — R1030 Lower abdominal pain, unspecified: Secondary | ICD-10-CM | POA: Diagnosis not present

## 2019-04-19 DIAGNOSIS — R109 Unspecified abdominal pain: Secondary | ICD-10-CM | POA: Insufficient documentation

## 2019-04-19 DIAGNOSIS — Z79899 Other long term (current) drug therapy: Secondary | ICD-10-CM | POA: Diagnosis not present

## 2019-04-19 DIAGNOSIS — Z8673 Personal history of transient ischemic attack (TIA), and cerebral infarction without residual deficits: Secondary | ICD-10-CM | POA: Insufficient documentation

## 2019-04-19 DIAGNOSIS — K219 Gastro-esophageal reflux disease without esophagitis: Secondary | ICD-10-CM | POA: Diagnosis not present

## 2019-04-19 DIAGNOSIS — I10 Essential (primary) hypertension: Secondary | ICD-10-CM | POA: Insufficient documentation

## 2019-04-19 LAB — COMPREHENSIVE METABOLIC PANEL
ALT: 26 U/L (ref 0–44)
AST: 26 U/L (ref 15–41)
Albumin: 4.1 g/dL (ref 3.5–5.0)
Alkaline Phosphatase: 53 U/L (ref 38–126)
Anion gap: 8 (ref 5–15)
BUN: 11 mg/dL (ref 8–23)
CO2: 25 mmol/L (ref 22–32)
Calcium: 8.8 mg/dL — ABNORMAL LOW (ref 8.9–10.3)
Chloride: 108 mmol/L (ref 98–111)
Creatinine, Ser: 0.71 mg/dL (ref 0.44–1.00)
GFR calc Af Amer: >60 mL/min (ref >60)
GFR calc non Af Amer: >60 mL/min (ref >60)
Glucose, Bld: 86 mg/dL (ref 70–99)
Potassium: 3.5 mmol/L (ref 3.5–5.1)
Sodium: 141 mmol/L (ref 135–145)
Total Bilirubin: 0.5 mg/dL (ref 0.3–1.2)
Total Protein: 6.4 g/dL — ABNORMAL LOW (ref 6.5–8.1)

## 2019-04-19 LAB — CBC WITH DIFFERENTIAL/PLATELET
Abs Immature Granulocytes: 0.01 10*3/uL (ref 0.00–0.07)
Basophils Absolute: 0 10*3/uL (ref 0.0–0.1)
Basophils Relative: 0 %
Eosinophils Absolute: 0.1 10*3/uL (ref 0.0–0.5)
Eosinophils Relative: 3 %
HCT: 34.9 % — ABNORMAL LOW (ref 36.0–46.0)
Hemoglobin: 11.1 g/dL — ABNORMAL LOW (ref 12.0–15.0)
Immature Granulocytes: 0 %
Lymphocytes Relative: 32 %
Lymphs Abs: 1.4 10*3/uL (ref 0.7–4.0)
MCH: 33.9 pg (ref 26.0–34.0)
MCHC: 31.8 g/dL (ref 30.0–36.0)
MCV: 106.7 fL — ABNORMAL HIGH (ref 80.0–100.0)
Monocytes Absolute: 0.5 10*3/uL (ref 0.1–1.0)
Monocytes Relative: 12 %
Neutro Abs: 2.3 10*3/uL (ref 1.7–7.7)
Neutrophils Relative %: 53 %
Platelets: 259 10*3/uL (ref 150–400)
RBC: 3.27 MIL/uL — ABNORMAL LOW (ref 3.87–5.11)
RDW: 12.8 % (ref 11.5–15.5)
WBC: 4.4 10*3/uL (ref 4.0–10.5)
nRBC: 0 % (ref 0.0–0.2)

## 2019-04-19 LAB — URINALYSIS, ROUTINE W REFLEX MICROSCOPIC
Bilirubin Urine: NEGATIVE
Glucose, UA: NEGATIVE mg/dL
Hgb urine dipstick: NEGATIVE
Ketones, ur: NEGATIVE mg/dL
Leukocytes,Ua: NEGATIVE
Nitrite: NEGATIVE
Protein, ur: NEGATIVE mg/dL
Specific Gravity, Urine: 1.01 (ref 1.005–1.030)
pH: 5 (ref 5.0–8.0)

## 2019-04-19 LAB — LIPASE, BLOOD: Lipase: 20 U/L (ref 11–51)

## 2019-04-19 MED ORDER — ONDANSETRON HCL 4 MG/2ML IJ SOLN
4.0000 mg | Freq: Once | INTRAMUSCULAR | Status: AC
  Administered 2019-04-19: 4 mg via INTRAVENOUS
  Filled 2019-04-19: qty 2

## 2019-04-19 MED ORDER — MORPHINE SULFATE (PF) 4 MG/ML IV SOLN
4.0000 mg | Freq: Once | INTRAVENOUS | Status: AC
  Administered 2019-04-19: 4 mg via INTRAVENOUS
  Filled 2019-04-19: qty 1

## 2019-04-19 MED ORDER — SODIUM CHLORIDE 0.9 % IV BOLUS
500.0000 mL | Freq: Once | INTRAVENOUS | Status: AC
  Administered 2019-04-19: 500 mL via INTRAVENOUS

## 2019-04-19 MED ORDER — DIAZEPAM 2 MG PO TABS: 2.0000 mg | ORAL_TABLET | Freq: Four times a day (QID) | ORAL | 0 refills | Status: AC | PRN

## 2019-04-19 NOTE — Discharge Instructions (Addendum)
Continue medications as previously prescribed.  Begin taking Valium as prescribed as needed for anxiety.  Follow-up with your primary doctor if not improving in the next week.

## 2019-04-19 NOTE — ED Triage Notes (Signed)
Pt c/o pain in rlq radiating around to back for the past month.  Reports was told in the past she may have a hernia.  Denies any urinary symptoms.  Denies n/v/d.  LBM was this morning and was normal per pt.

## 2019-04-19 NOTE — ED Notes (Signed)
Pt resting. Wants lunch. Advised waiting on all labs. Nad.

## 2019-04-19 NOTE — ED Provider Notes (Signed)
[facility] EMERGENCY DEPARTMENT Provider Note   CSN: [id number] Arrival date & time: 04/19/19  6759    History   Chief Complaint Chief Complaint  Patient presents with  . Back Pain  . Abdominal Pain    HPI [name] Rojas is a 78 y.o. female.     Patient is a 78 year old female with past medical history of hypertension, fibromyalgia, chronic back pain, anemia.  She presents today for evaluation of back and abdominal pain.  This is been ongoing for greater than 1 month.  She was seen here previously and had a CT scan and laboratory studies performed.  These were unremarkable and she was discharged.  She returns today stating that this discomfort is not improving.  She describes pain in both flanks that radiates to both lower quadrants that is present all the time, but worse with movement and ambulation.  She denies any changes in her bowel or bladder habits.  She denies any fevers or chills.  The history is provided by the patient.  Flank Pain  This is a new problem. Episode onset: 1 month ago. The problem occurs constantly. The problem has not changed since onset.Associated symptoms include abdominal pain. Exacerbated by: Movement, palpation, and ambulation. Nothing relieves the symptoms. She has tried nothing for the symptoms.    Past Medical History:  Diagnosis Date  . Anemia   . Anxiety   . Arthritis   . Chronic back pain   . Depression   . Fibromyalgia   . GERD (gastroesophageal reflux disease)   . Hypertension   . Osteoporosis   . Stroke [facility])     Patient Active Problem List   Diagnosis Date Noted  . Gait instability   . Vertigo   . Dizziness 06/10/2018  . CVA (cerebral vascular accident) (HCC) 06/10/2018  . Major depressive disorder, recurrent episode, mild with anxious distress (HCC) 09/25/2017  . Mild neurocognitive disorder 07/12/2017  . Vitamin D deficiency 06/27/2017  . Osteoporosis 06/27/2017  . Fibromyalgia 03/30/2017  . History of medication noncompliance  03/30/2017  . Osteoporosis of lower leg associated with endocrine disorder 03/30/2017  . Primary osteoarthritis of left shoulder 04/11/2016  . Hearing difficulty 09/17/2015  . Neuropathic pain 04/10/2013  . Anxiety disorder 04/10/2013  . Failed back syndrome 08/24/2012  . Pulmonary hypertension (HCC) 04/25/2012  . Congenital heart disease with intracardiac shunting 04/25/2012  . History of stroke 05/23/2011  . Status post total knee replacement 02/03/2010  . GERD (gastroesophageal reflux disease) 02/03/2010  . Chronic pain 02/02/2010  . Essential hypertension 01/15/2010  . Status post bariatric surgery 04/30/2009    Past Surgical History:  Procedure Laterality Date  . ABDOMINAL HYSTERECTOMY     bleeding  . BACK SURGERY    . GASTRIC BYPASS    . KNEE SURGERY    . TONSILLECTOMY       OB History    Gravida  7   Para  7   Term  7   Preterm      AB      Living        SAB      TAB      Ectopic      Multiple      Live Births               Home Medications    Prior to Admission medications   Medication Sig Start Date End Date Taking? Authorizing Provider  amLODipine (NORVASC) 5 MG tablet Take 5 mg by  mouth daily.    [provider]  calcium-vitamin D (OSCAL WITH D) 500-200 MG-UNIT tablet Take 1 tablet by mouth 2 (two) times a week.     [provider]  cholecalciferol (VITAMIN D) 1000 units tablet Take 1,000 Units by mouth daily.    [provider]  clonazePAM (KLONOPIN) 0.5 MG tablet Take 0.5 mg by mouth at bedtime as needed. 12/28/18   [provider]  escitalopram (LEXAPRO) 10 MG tablet Take 1 tablet (10 mg total) by mouth daily. Patient not taking: Reported on 02/26/2019 10/10/18   Neysa HotterHisada, Reina, MD  hydrochlorothiazide (HYDRODIURIL) 25 MG tablet Take 1 tablet (25 mg total) by mouth daily. Patient not taking: Reported on 03/16/2019 08/03/17   Eustace MooreNelson, Yvonne Sue, MD  hydrochlorothiazide (HYDRODIURIL) 50 MG tablet Take 50  mg by mouth daily.    [provider]  lidocaine (LIDODERM) 5 % Place 1 patch onto the skin daily. 03/04/19   [provider]  loratadine (CLARITIN) 10 MG tablet Take 10 mg by mouth daily as needed for allergies.     [provider]  meloxicam (MOBIC) 15 MG tablet Take 15 mg by mouth daily.    [provider]  methocarbamol (ROBAXIN) 500 MG tablet Take 1 tablet (500 mg total) by mouth 2 (two) times daily as needed for muscle spasms. Patient not taking: Reported on 03/16/2019 12/22/18   Albrizze, Kaitlyn E, PA-C  MYRBETRIQ 25 MG TB24 tablet Take 1 tablet by mouth daily. 02/22/19   [provider]  naproxen (NAPROSYN) 500 MG tablet Take 1 tablet (500 mg total) by mouth 2 (two) times daily. Patient not taking: Reported on 02/26/2019 03/06/18   Bethel BornGekas, Kelly Marie, PA-C  omeprazole (PRILOSEC) 40 MG capsule Take 1 capsule by mouth daily. 02/17/19   [provider]  oxyCODONE-acetaminophen (PERCOCET) 7.5-325 MG tablet Take 1 tablet by mouth every 6 (six) hours as needed for moderate pain or severe pain.    [provider]  potassium chloride (KLOR-CON M10) 10 MEQ tablet Take 1 tablet (10 mEq total) by mouth daily. Patient not taking: Reported on 02/26/2019 08/03/17   Eustace MooreNelson, Yvonne Sue, MD  pregabalin (LYRICA) 75 MG capsule Take 1 capsule by mouth 3 (three) times daily. 02/14/19   [provider]  tiZANidine (ZANAFLEX) 2 MG tablet Take 4 mg by mouth at bedtime.  10/26/18   [provider]    Family History Family History  Problem Relation Age of Onset  . Arthritis Mother   . Asthma Mother   . Heart disease Mother   . Early death Father        trauma  . Heart disease Sister   . Stroke Sister     Social History Social History   Tobacco Use  . Smoking status: Never Smoker  . Smokeless tobacco: Never Used  Substance Use Topics  . Alcohol use: No  . Drug use: No     Allergies   Codeine; Iodinated diagnostic agents;  Lyrica [pregabalin]; Statins; and Tramadol   Review of Systems Review of Systems  Gastrointestinal: Positive for abdominal pain.  Genitourinary: Positive for flank pain.  All other systems reviewed and are negative.    Physical Exam Updated Vital Signs BP (!) 130/96 (BP Location: Left Arm)   Pulse 78   Temp 98.5 F (36.9 C) (Oral)   Resp 20   Ht 5\' 4"  (1.626 m)   Wt 86.2 kg   SpO2 99%   BMI 32.61 kg/m   Physical  Exam Vitals signs and nursing note reviewed.  Constitutional:      General: She is not in acute distress.    Appearance: She is well-developed. She is not diaphoretic.  HENT:     Head: Normocephalic and atraumatic.  Neck:     Musculoskeletal: Normal range of motion and neck supple.  Cardiovascular:     Rate and Rhythm: Normal rate and regular rhythm.     Heart sounds: No murmur. No friction rub. No gallop.   Pulmonary:     Effort: Pulmonary effort is normal. No respiratory distress.     Breath sounds: Normal breath sounds. No wheezing.  Abdominal:     General: Bowel sounds are normal. There is no distension.     Palpations: Abdomen is soft.     Tenderness: There is abdominal tenderness in the right lower quadrant, suprapubic area and left lower quadrant. There is right CVA tenderness and left CVA tenderness. There is no guarding or rebound.  Musculoskeletal: Normal range of motion.  Skin:    General: Skin is warm and dry.  Neurological:     Mental Status: She is alert and oriented to person, place, and time.      ED Treatments / Results  Labs (all labs ordered are listed, but only abnormal results are displayed) Labs Reviewed  COMPREHENSIVE METABOLIC PANEL  CBC WITH DIFFERENTIAL/PLATELET  LIPASE, BLOOD  URINALYSIS, ROUTINE W REFLEX MICROSCOPIC    EKG None  Radiology No results found.  Procedures Procedures (including critical care time)  Medications Ordered in ED Medications  sodium chloride 0.9 % bolus 500 mL (has no administration in  time range)  ondansetron (ZOFRAN) injection 4 mg (has no administration in time range)  morphine 4 MG/ML injection 4 mg (has no administration in time range)     Initial Impression / Assessment and Plan / ED Course  I have reviewed the triage vital signs and the nursing notes.  Pertinent labs & imaging results that were available during my care of the patient were reviewed by me and considered in my medical decision making (see chart for details).  Patient presenting with complaints of low back pain and abdominal pain.  She was seen recently with similar complaints, however no definite cause was found.  Today's work-up shows no significant abnormalities.  Repeat CT was negative and laboratory studies are reassuring.  Patient feeling better after medications given.  She will be discharged and is to follow-up with primary doctor.  Final Clinical Impressions(s) / ED Diagnoses   Final diagnoses:  None    ED Discharge Orders    None       Geoffery Lyons, MD 04/24/19 1239

## 2019-04-22 ENCOUNTER — Other Ambulatory Visit: Payer: Self-pay | Admitting: Internal Medicine

## 2019-04-22 DIAGNOSIS — Z0000 Encounter for general adult medical examination without abnormal findings: Secondary | ICD-10-CM | POA: Diagnosis not present

## 2019-04-22 DIAGNOSIS — D509 Iron deficiency anemia, unspecified: Secondary | ICD-10-CM | POA: Diagnosis not present

## 2019-04-22 DIAGNOSIS — E2839 Other primary ovarian failure: Secondary | ICD-10-CM

## 2019-04-22 DIAGNOSIS — M2550 Pain in unspecified joint: Secondary | ICD-10-CM | POA: Diagnosis not present

## 2019-04-23 DIAGNOSIS — M545 Low back pain: Secondary | ICD-10-CM | POA: Diagnosis not present

## 2019-04-23 DIAGNOSIS — M130 Polyarthritis, unspecified: Secondary | ICD-10-CM | POA: Diagnosis not present

## 2019-04-23 DIAGNOSIS — Z79891 Long term (current) use of opiate analgesic: Secondary | ICD-10-CM | POA: Diagnosis not present

## 2019-04-23 DIAGNOSIS — M797 Fibromyalgia: Secondary | ICD-10-CM | POA: Diagnosis not present

## 2019-05-10 ENCOUNTER — Emergency Department (HOSPITAL_COMMUNITY)
Admission: EM | Admit: 2019-05-10 | Discharge: 2019-05-10 | Disposition: A | Discharge status: Home / Self Care | Payer: Medicare Other | Attending: Emergency Medicine | Admitting: Emergency Medicine

## 2019-05-10 ENCOUNTER — Encounter (HOSPITAL_COMMUNITY): Payer: Self-pay | Admitting: Emergency Medicine

## 2019-05-10 ENCOUNTER — Other Ambulatory Visit: Payer: Self-pay

## 2019-05-10 ENCOUNTER — Emergency Department (HOSPITAL_COMMUNITY): Payer: Medicare Other

## 2019-05-10 DIAGNOSIS — Z79899 Other long term (current) drug therapy: Secondary | ICD-10-CM | POA: Insufficient documentation

## 2019-05-10 DIAGNOSIS — Z8673 Personal history of transient ischemic attack (TIA), and cerebral infarction without residual deficits: Secondary | ICD-10-CM | POA: Insufficient documentation

## 2019-05-10 DIAGNOSIS — T50901A Poisoning by unspecified drugs, medicaments and biological substances, accidental (unintentional), initial encounter: Secondary | ICD-10-CM

## 2019-05-10 DIAGNOSIS — T402X1A Poisoning by other opioids, accidental (unintentional), initial encounter: Secondary | ICD-10-CM | POA: Insufficient documentation

## 2019-05-10 DIAGNOSIS — I10 Essential (primary) hypertension: Secondary | ICD-10-CM | POA: Diagnosis not present

## 2019-05-10 DIAGNOSIS — R001 Bradycardia, unspecified: Secondary | ICD-10-CM | POA: Diagnosis not present

## 2019-05-10 DIAGNOSIS — M549 Dorsalgia, unspecified: Secondary | ICD-10-CM | POA: Insufficient documentation

## 2019-05-10 DIAGNOSIS — R5381 Other malaise: Secondary | ICD-10-CM | POA: Diagnosis not present

## 2019-05-10 DIAGNOSIS — I959 Hypotension, unspecified: Secondary | ICD-10-CM | POA: Diagnosis not present

## 2019-05-10 DIAGNOSIS — G8929 Other chronic pain: Secondary | ICD-10-CM | POA: Insufficient documentation

## 2019-05-10 DIAGNOSIS — R52 Pain, unspecified: Secondary | ICD-10-CM | POA: Diagnosis not present

## 2019-05-10 DIAGNOSIS — R42 Dizziness and giddiness: Secondary | ICD-10-CM | POA: Diagnosis not present

## 2019-05-10 LAB — COMPREHENSIVE METABOLIC PANEL
ALT: 20 U/L (ref 0–44)
AST: 22 U/L (ref 15–41)
Albumin: 3.6 g/dL (ref 3.5–5.0)
Alkaline Phosphatase: 48 U/L (ref 38–126)
Anion gap: 10 (ref 5–15)
BUN: 16 mg/dL (ref 8–23)
CO2: 29 mmol/L (ref 22–32)
Calcium: 8.6 mg/dL — ABNORMAL LOW (ref 8.9–10.3)
Chloride: 100 mmol/L (ref 98–111)
Creatinine, Ser: 0.7 mg/dL (ref 0.44–1.00)
GFR calc Af Amer: >60 mL/min (ref >60)
GFR calc non Af Amer: >60 mL/min (ref >60)
Glucose, Bld: 109 mg/dL — ABNORMAL HIGH (ref 70–99)
Potassium: 3.3 mmol/L — ABNORMAL LOW (ref 3.5–5.1)
Sodium: 139 mmol/L (ref 135–145)
Total Bilirubin: 0.5 mg/dL (ref 0.3–1.2)
Total Protein: 5.8 g/dL — ABNORMAL LOW (ref 6.5–8.1)

## 2019-05-10 LAB — URINALYSIS, ROUTINE W REFLEX MICROSCOPIC
Bilirubin Urine: NEGATIVE
Glucose, UA: NEGATIVE mg/dL
Hgb urine dipstick: NEGATIVE
Ketones, ur: NEGATIVE mg/dL
Leukocytes,Ua: NEGATIVE
Nitrite: NEGATIVE
Protein, ur: NEGATIVE mg/dL
Specific Gravity, Urine: 1.005 (ref 1.005–1.030)
pH: 6 (ref 5.0–8.0)

## 2019-05-10 LAB — CBC WITH DIFFERENTIAL/PLATELET
Abs Immature Granulocytes: 0.01 10*3/uL (ref 0.00–0.07)
Basophils Absolute: 0 10*3/uL (ref 0.0–0.1)
Basophils Relative: 1 %
Eosinophils Absolute: 0.1 10*3/uL (ref 0.0–0.5)
Eosinophils Relative: 2 %
HCT: 31.3 % — ABNORMAL LOW (ref 36.0–46.0)
Hemoglobin: 10.1 g/dL — ABNORMAL LOW (ref 12.0–15.0)
Immature Granulocytes: 0 %
Lymphocytes Relative: 29 %
Lymphs Abs: 1.1 10*3/uL (ref 0.7–4.0)
MCH: 33.2 pg (ref 26.0–34.0)
MCHC: 32.3 g/dL (ref 30.0–36.0)
MCV: 103 fL — ABNORMAL HIGH (ref 80.0–100.0)
Monocytes Absolute: 0.5 10*3/uL (ref 0.1–1.0)
Monocytes Relative: 12 %
Neutro Abs: 2.2 10*3/uL (ref 1.7–7.7)
Neutrophils Relative %: 56 %
Platelets: 219 10*3/uL (ref 150–400)
RBC: 3.04 MIL/uL — ABNORMAL LOW (ref 3.87–5.11)
RDW: 12.6 % (ref 11.5–15.5)
WBC: 3.9 10*3/uL — ABNORMAL LOW (ref 4.0–10.5)
nRBC: 0 % (ref 0.0–0.2)

## 2019-05-10 LAB — RAPID URINE DRUG SCREEN, HOSP PERFORMED
Amphetamines: NOT DETECTED
Barbiturates: NOT DETECTED
Benzodiazepines: NOT DETECTED
Cocaine: NOT DETECTED
Opiates: POSITIVE — AB
Tetrahydrocannabinol: NOT DETECTED

## 2019-05-10 LAB — TROPONIN I: Troponin I: <0.03 ng/mL (ref <0.03)

## 2019-05-10 LAB — ETHANOL: Alcohol, Ethyl (B): <10 mg/dL (ref <10)

## 2019-05-10 MED ORDER — SODIUM CHLORIDE 0.9 % IV BOLUS
500.0000 mL | Freq: Once | INTRAVENOUS | Status: AC
  Administered 2019-05-10: 500 mL via INTRAVENOUS

## 2019-05-10 MED ORDER — ACETAMINOPHEN 325 MG PO TABS
650.0000 mg | ORAL_TABLET | ORAL | Status: DC | PRN
  Administered 2019-05-10: 650 mg via ORAL
  Filled 2019-05-10: qty 2

## 2019-05-10 NOTE — ED Notes (Signed)
ED Provider at bedside. 

## 2019-05-10 NOTE — ED Notes (Signed)
Pt states she does not need to urinate at this time, aware of DO, call light w/in reach 

## 2019-05-10 NOTE — ED Triage Notes (Signed)
Per EMS patient states she felt dizzy this morning. EMS states BP 95/61 Patient is alert in triage.

## 2019-05-10 NOTE — ED Provider Notes (Signed)
[facility] EMERGENCY DEPARTMENT Provider Note   CSN: [id number] Arrival date & time: 05/10/19  0907    History   Chief Complaint Chief Complaint  Patient presents with  . Hypotension    HPI [name] Rojas is a 78 y.o. female.     HPI Patient with history of chronic back pain.  States she took 10 mg of Percocet this morning and then took another 5 mg of Percocet.  States she got up to go to the bathroom and she felt lightheaded and had to catch himself against the wall.  She denies hitting her head or passing out.  States she called EMS.  Blood pressure was 95/61 when they arrived.  Patient admits to urinary frequency but denies dysuria, or hematuria.  No recent fever or chills.  Currently denying any pain.  Specifically, denies chest pain or abdominal pain.  No shortness of breath or cough.  No recent fever or chills. Past Medical History:  Diagnosis Date  . Anemia   . Anxiety   . Arthritis   . Chronic back pain   . Depression   . Fibromyalgia   . GERD (gastroesophageal reflux disease)   . Hypertension   . Osteoporosis   . Stroke [facility])     Patient Active Problem List   Diagnosis Date Noted  . Gait instability   . Vertigo   . Dizziness 06/10/2018  . CVA (cerebral vascular accident) (HCC) 06/10/2018  . Major depressive disorder, recurrent episode, mild with anxious distress (HCC) 09/25/2017  . Mild neurocognitive disorder 07/12/2017  . Vitamin D deficiency 06/27/2017  . Osteoporosis 06/27/2017  . Fibromyalgia 03/30/2017  . History of medication noncompliance 03/30/2017  . Osteoporosis of lower leg associated with endocrine disorder 03/30/2017  . Primary osteoarthritis of left shoulder 04/11/2016  . Hearing difficulty 09/17/2015  . Neuropathic pain 04/10/2013  . Anxiety disorder 04/10/2013  . Failed back syndrome 08/24/2012  . Pulmonary hypertension (HCC) 04/25/2012  . Congenital heart disease with intracardiac shunting 04/25/2012  . History of stroke 05/23/2011  .  Status post total knee replacement 02/03/2010  . GERD (gastroesophageal reflux disease) 02/03/2010  . Chronic pain 02/02/2010  . Essential hypertension 01/15/2010  . Status post bariatric surgery 04/30/2009    Past Surgical History:  Procedure Laterality Date  . ABDOMINAL HYSTERECTOMY     bleeding  . BACK SURGERY    . GASTRIC BYPASS    . KNEE SURGERY    . TONSILLECTOMY       OB History    Gravida  7   Para  7   Term  7   Preterm      AB      Living        SAB      TAB      Ectopic      Multiple      Live Births               Home Medications    Prior to Admission medications   Medication Sig Start Date End Date Taking? Authorizing Provider  amLODipine (NORVASC) 5 MG tablet Take 5 mg by mouth daily.    [provider]  calcium-vitamin D (OSCAL WITH D) 500-200 MG-UNIT tablet Take 1 tablet by mouth 2 (two) times a week.     [provider]  cholecalciferol (VITAMIN D) 1000 units tablet Take 1,000 Units by mouth daily.    [provider]  clonazePAM (KLONOPIN) 0.5 MG tablet Take 0.5 mg by  mouth at bedtime as needed. 12/28/18   [provider]  diazepam (VALIUM) 2 MG tablet Take 1 tablet (2 mg total) by mouth every 6 (six) hours as needed for anxiety (spasms). 04/19/19   Geoffery Lyonselo, Douglas, MD  escitalopram (LEXAPRO) 10 MG tablet Take 1 tablet (10 mg total) by mouth daily. Patient not taking: Reported on 02/26/2019 10/10/18   Neysa HotterHisada, Reina, MD  hydrochlorothiazide (HYDRODIURIL) 25 MG tablet Take 1 tablet (25 mg total) by mouth daily. Patient not taking: Reported on 03/16/2019 08/03/17   Eustace MooreNelson, Yvonne Sue, MD  hydrochlorothiazide (HYDRODIURIL) 50 MG tablet Take 50 mg by mouth daily.    [provider]  lidocaine (LIDODERM) 5 % Place 1 patch onto the skin daily. 03/04/19   [provider]  loratadine (CLARITIN) 10 MG tablet Take 10 mg by mouth daily as needed for allergies.     [provider]  meloxicam  (MOBIC) 15 MG tablet Take 15 mg by mouth daily.    [provider]  methocarbamol (ROBAXIN) 500 MG tablet Take 1 tablet (500 mg total) by mouth 2 (two) times daily as needed for muscle spasms. Patient not taking: Reported on 03/16/2019 12/22/18   Albrizze, Kaitlyn E, PA-C  MYRBETRIQ 25 MG TB24 tablet Take 1 tablet by mouth daily. 02/22/19   [provider]  naproxen (NAPROSYN) 500 MG tablet Take 1 tablet (500 mg total) by mouth 2 (two) times daily. Patient not taking: Reported on 02/26/2019 03/06/18   Bethel BornGekas, Kelly Marie, PA-C  omeprazole (PRILOSEC) 40 MG capsule Take 1 capsule by mouth daily. 02/17/19   [provider]  oxyCODONE-acetaminophen (PERCOCET) 7.5-325 MG tablet Take 1 tablet by mouth every 6 (six) hours as needed for moderate pain or severe pain.    [provider]  potassium chloride (KLOR-CON M10) 10 MEQ tablet Take 1 tablet (10 mEq total) by mouth daily. Patient not taking: Reported on 02/26/2019 08/03/17   Eustace MooreNelson, Yvonne Sue, MD  pregabalin (LYRICA) 75 MG capsule Take 1 capsule by mouth 3 (three) times daily. 02/14/19   [provider]  tiZANidine (ZANAFLEX) 2 MG tablet Take 4 mg by mouth at bedtime.  10/26/18   [provider]    Family History Family History  Problem Relation Age of Onset  . Arthritis Mother   . Asthma Mother   . Heart disease Mother   . Early death Father        trauma  . Heart disease Sister   . Stroke Sister     Social History Social History   Tobacco Use  . Smoking status: Never Smoker  . Smokeless tobacco: Never Used  Substance Use Topics  . Alcohol use: No  . Drug use: No     Allergies   Codeine; Iodinated diagnostic agents; Lyrica [pregabalin]; Statins; and Tramadol   Review of Systems Review of Systems  Constitutional: Negative for chills and fever.  HENT: Negative for sore throat and trouble swallowing.   Eyes: Negative for visual disturbance.  Respiratory: Negative for shortness of  breath and wheezing.   Cardiovascular: Negative for chest pain, palpitations and leg swelling.  Gastrointestinal: Negative for abdominal pain, constipation, diarrhea, nausea and vomiting.  Genitourinary: Positive for frequency. Negative for dysuria, flank pain and hematuria.  Musculoskeletal: Negative for back pain, myalgias and neck pain.  Skin: Negative for rash and wound.  Neurological: Positive for dizziness and light-headedness. Negative for weakness, numbness and headaches.  All other systems reviewed and are negative.    Physical Exam  Updated Vital Signs BP 106/65 (BP Location: Left Arm)   Pulse 93   Temp 98.1 F (36.7 C) (Oral)   Resp 16   Ht  (1.626 m)   Wt 86.2 kg   SpO2 96%   BMI 32.61 kg/m   Physical Exam Vitals signs and nursing note reviewed.  Constitutional:      Appearance: Normal appearance. She is well-developed.     Comments: Mildly drowsy  HENT:     Head: Normocephalic and atraumatic.     Comments: No obvious head trauma.    Nose: Nose normal.     Mouth/Throat:     Mouth: Mucous membranes are moist.  Eyes:     Pupils: Pupils are equal, round, and reactive to light.     Comments: Pinpoint pupils bilaterally.  Neck:     Musculoskeletal: Normal range of motion and neck supple. No neck rigidity or muscular tenderness.  Cardiovascular:     Rate and Rhythm: Regular rhythm. Bradycardia present.     Heart sounds: No murmur. No friction rub. No gallop.   Pulmonary:     Effort: Pulmonary effort is normal. No respiratory distress.     Breath sounds: Normal breath sounds. No stridor. No wheezing, rhonchi or rales.  Chest:     Chest wall: No tenderness.  Abdominal:     General: Bowel sounds are normal.     Palpations: Abdomen is soft.     Tenderness: There is no abdominal tenderness. There is no guarding or rebound.  Musculoskeletal: Normal range of motion.        General: No swelling, tenderness, deformity or signs of injury.     Right lower leg: No  edema.     Left lower leg: No edema.  Lymphadenopathy:     Cervical: No cervical adenopathy.  Skin:    General: Skin is warm and dry.     Capillary Refill: Capillary refill takes less than 2 seconds.     Findings: No erythema or rash.  Neurological:     General: No focal deficit present.     Mental Status: She is oriented to person, place, and time.     Comments: 5/5 motor in all extremities.  Sensation fully intact.  Psychiatric:        Behavior: Behavior normal.      ED Treatments / Results  Labs (all labs ordered are listed, but only abnormal results are displayed) Labs Reviewed  CBC WITH DIFFERENTIAL/PLATELET - Abnormal; Notable for the following components:      Result Value   WBC 3.9 (*)    RBC 3.04 (*)    Hemoglobin 10.1 (*)    HCT 31.3 (*)    MCV 103.0 (*)    All other components within normal limits  COMPREHENSIVE METABOLIC PANEL - Abnormal; Notable for the following components:   Potassium 3.3 (*)    Glucose, Bld 109 (*)    Calcium 8.6 (*)    Total Protein 5.8 (*)    All other components within normal limits  RAPID URINE DRUG SCREEN, HOSP PERFORMED - Abnormal; Notable for the following components:   Opiates POSITIVE (*)    All other components within normal limits  ETHANOL  URINALYSIS, ROUTINE W REFLEX MICROSCOPIC  TROPONIN I    EKG EKG Interpretation  Date/Time:  Friday May 10 2019 09:27:37 EDT Ventricular Rate:  60 PR Interval:    QRS Duration: 90 QT Interval:  419 QTC Calculation: 419 R Axis:   -17 Text Interpretation:  Sinus rhythm Multiform ventricular premature complexes Abnormal R-wave progression, early transition Left ventricular hypertrophy Nonspecific T abnormalities, diffuse leads Confirmed by Loren Racer ([id number]) on 05/10/2019 9:47:24 AM   Radiology Dg Chest Port 1 View  Result Date: 05/10/2019 CLINICAL DATA:  Dizziness and hypotension this morning. Former smoker. EXAM: PORTABLE CHEST 1 VIEW COMPARISON:  Radiographs 06/10/2018.  FINDINGS: 0946 hours. The heart size and mediastinal contours are stable. There is aortic atherosclerosis. The lungs are clear. There is no pleural effusion or pneumothorax. No acute osseous findings are evident. Telemetry leads overlie the chest. IMPRESSION: Stable chest.  No acute cardiopulmonary process. Electronically Signed   By: Carey Bullocks M.D.   On: 05/10/2019 10:13    Procedures Procedures (including critical care time)  Medications Ordered in ED Medications  acetaminophen (TYLENOL) tablet 650 mg (650 mg Oral Given 05/10/19 1159)  sodium chloride 0.9 % bolus 500 mL (0 mLs Intravenous Stopped 05/10/19 1142)     Initial Impression / Assessment and Plan / ED Course  I have reviewed the triage vital signs and the nursing notes.  Pertinent labs & imaging results that were available during my care of the patient were reviewed by me and considered in my medical decision making (see chart for details).        Patient's vital signs have normalized.  Patient is more alert.  Suspect she accidentally overdosed on her pain medication.  Denies SI.  I have advised her only to take medication as it is prescribed.  Final Clinical Impressions(s) / ED Diagnoses   Final diagnoses:  Accidental overdose, initial encounter    ED Discharge Orders    None       Loren Racer, MD 05/10/19 1339

## 2019-05-10 NOTE — Discharge Instructions (Addendum)
Take your pain medication as it is prescribed.

## 2019-05-14 DIAGNOSIS — M5106 Intervertebral disc disorders with myelopathy, lumbar region: Secondary | ICD-10-CM | POA: Diagnosis not present

## 2019-05-14 DIAGNOSIS — M545 Low back pain: Secondary | ICD-10-CM | POA: Diagnosis not present

## 2019-05-14 DIAGNOSIS — M4326 Fusion of spine, lumbar region: Secondary | ICD-10-CM | POA: Diagnosis not present

## 2019-05-16 DIAGNOSIS — M1990 Unspecified osteoarthritis, unspecified site: Secondary | ICD-10-CM | POA: Diagnosis not present

## 2019-05-16 DIAGNOSIS — M130 Polyarthritis, unspecified: Secondary | ICD-10-CM | POA: Diagnosis not present

## 2019-05-16 DIAGNOSIS — D509 Iron deficiency anemia, unspecified: Secondary | ICD-10-CM | POA: Diagnosis not present

## 2019-05-16 DIAGNOSIS — M545 Low back pain: Secondary | ICD-10-CM | POA: Diagnosis not present

## 2019-05-16 DIAGNOSIS — G894 Chronic pain syndrome: Secondary | ICD-10-CM | POA: Diagnosis not present

## 2019-05-16 DIAGNOSIS — M797 Fibromyalgia: Secondary | ICD-10-CM | POA: Diagnosis not present

## 2019-05-16 DIAGNOSIS — Z79891 Long term (current) use of opiate analgesic: Secondary | ICD-10-CM | POA: Diagnosis not present

## 2019-05-20 ENCOUNTER — Other Ambulatory Visit: Payer: Self-pay | Admitting: Rehabilitation

## 2019-05-20 DIAGNOSIS — M5106 Intervertebral disc disorders with myelopathy, lumbar region: Secondary | ICD-10-CM

## 2019-06-06 DIAGNOSIS — R001 Bradycardia, unspecified: Secondary | ICD-10-CM | POA: Diagnosis not present

## 2019-06-06 DIAGNOSIS — I959 Hypotension, unspecified: Secondary | ICD-10-CM | POA: Diagnosis not present

## 2019-06-06 DIAGNOSIS — R531 Weakness: Secondary | ICD-10-CM | POA: Diagnosis not present

## 2019-06-06 DIAGNOSIS — D649 Anemia, unspecified: Secondary | ICD-10-CM | POA: Diagnosis not present

## 2019-06-06 DIAGNOSIS — R079 Chest pain, unspecified: Secondary | ICD-10-CM | POA: Diagnosis not present

## 2019-06-06 DIAGNOSIS — I10 Essential (primary) hypertension: Secondary | ICD-10-CM | POA: Diagnosis not present

## 2019-06-06 DIAGNOSIS — I2510 Atherosclerotic heart disease of native coronary artery without angina pectoris: Secondary | ICD-10-CM | POA: Diagnosis not present

## 2019-06-08 ENCOUNTER — Other Ambulatory Visit: Payer: Medicare Other

## 2019-06-14 DIAGNOSIS — D509 Iron deficiency anemia, unspecified: Secondary | ICD-10-CM | POA: Diagnosis not present

## 2019-06-14 DIAGNOSIS — Z0000 Encounter for general adult medical examination without abnormal findings: Secondary | ICD-10-CM | POA: Diagnosis not present

## 2019-06-14 DIAGNOSIS — M2550 Pain in unspecified joint: Secondary | ICD-10-CM | POA: Diagnosis not present

## 2019-06-27 DIAGNOSIS — G894 Chronic pain syndrome: Secondary | ICD-10-CM | POA: Diagnosis not present

## 2019-06-27 DIAGNOSIS — M150 Primary generalized (osteo)arthritis: Secondary | ICD-10-CM | POA: Diagnosis not present

## 2019-06-27 DIAGNOSIS — M5416 Radiculopathy, lumbar region: Secondary | ICD-10-CM | POA: Diagnosis not present

## 2019-06-27 DIAGNOSIS — M961 Postlaminectomy syndrome, not elsewhere classified: Secondary | ICD-10-CM | POA: Diagnosis not present

## 2019-06-27 DIAGNOSIS — M797 Fibromyalgia: Secondary | ICD-10-CM | POA: Diagnosis not present

## 2019-07-04 DIAGNOSIS — M5416 Radiculopathy, lumbar region: Secondary | ICD-10-CM | POA: Diagnosis not present

## 2019-07-04 DIAGNOSIS — G894 Chronic pain syndrome: Secondary | ICD-10-CM | POA: Diagnosis not present

## 2019-07-04 DIAGNOSIS — M961 Postlaminectomy syndrome, not elsewhere classified: Secondary | ICD-10-CM | POA: Diagnosis not present

## 2019-07-04 DIAGNOSIS — M797 Fibromyalgia: Secondary | ICD-10-CM | POA: Diagnosis not present

## 2019-07-04 DIAGNOSIS — M150 Primary generalized (osteo)arthritis: Secondary | ICD-10-CM | POA: Diagnosis not present

## 2019-07-05 DIAGNOSIS — E785 Hyperlipidemia, unspecified: Secondary | ICD-10-CM | POA: Diagnosis not present

## 2019-07-05 DIAGNOSIS — G459 Transient cerebral ischemic attack, unspecified: Secondary | ICD-10-CM | POA: Diagnosis not present

## 2019-07-05 DIAGNOSIS — I10 Essential (primary) hypertension: Secondary | ICD-10-CM | POA: Diagnosis not present

## 2019-07-05 DIAGNOSIS — E559 Vitamin D deficiency, unspecified: Secondary | ICD-10-CM | POA: Diagnosis not present

## 2019-07-05 DIAGNOSIS — R300 Dysuria: Secondary | ICD-10-CM | POA: Diagnosis not present

## 2019-07-05 DIAGNOSIS — M545 Low back pain: Secondary | ICD-10-CM | POA: Diagnosis not present

## 2019-07-05 DIAGNOSIS — I69991 Dysphagia following unspecified cerebrovascular disease: Secondary | ICD-10-CM | POA: Diagnosis not present

## 2019-08-01 DIAGNOSIS — M797 Fibromyalgia: Secondary | ICD-10-CM | POA: Diagnosis not present

## 2019-08-01 DIAGNOSIS — M961 Postlaminectomy syndrome, not elsewhere classified: Secondary | ICD-10-CM | POA: Diagnosis not present

## 2019-08-01 DIAGNOSIS — G894 Chronic pain syndrome: Secondary | ICD-10-CM | POA: Diagnosis not present

## 2019-08-01 DIAGNOSIS — M5416 Radiculopathy, lumbar region: Secondary | ICD-10-CM | POA: Diagnosis not present

## 2019-08-01 DIAGNOSIS — M150 Primary generalized (osteo)arthritis: Secondary | ICD-10-CM | POA: Diagnosis not present

## 2019-08-02 DIAGNOSIS — E559 Vitamin D deficiency, unspecified: Secondary | ICD-10-CM | POA: Diagnosis not present

## 2019-08-02 DIAGNOSIS — M1990 Unspecified osteoarthritis, unspecified site: Secondary | ICD-10-CM | POA: Diagnosis not present

## 2019-08-02 DIAGNOSIS — I10 Essential (primary) hypertension: Secondary | ICD-10-CM | POA: Diagnosis not present

## 2019-09-02 DIAGNOSIS — I10 Essential (primary) hypertension: Secondary | ICD-10-CM | POA: Diagnosis not present

## 2019-09-17 DIAGNOSIS — M961 Postlaminectomy syndrome, not elsewhere classified: Secondary | ICD-10-CM | POA: Diagnosis not present

## 2019-09-17 DIAGNOSIS — G894 Chronic pain syndrome: Secondary | ICD-10-CM | POA: Diagnosis not present

## 2019-09-17 DIAGNOSIS — M797 Fibromyalgia: Secondary | ICD-10-CM | POA: Diagnosis not present

## 2019-09-17 DIAGNOSIS — M150 Primary generalized (osteo)arthritis: Secondary | ICD-10-CM | POA: Diagnosis not present

## 2019-09-17 DIAGNOSIS — M5416 Radiculopathy, lumbar region: Secondary | ICD-10-CM | POA: Diagnosis not present

## 2019-10-04 DIAGNOSIS — G894 Chronic pain syndrome: Secondary | ICD-10-CM | POA: Diagnosis not present

## 2019-10-25 DIAGNOSIS — M797 Fibromyalgia: Secondary | ICD-10-CM | POA: Diagnosis not present

## 2019-10-25 DIAGNOSIS — M5416 Radiculopathy, lumbar region: Secondary | ICD-10-CM | POA: Diagnosis not present

## 2019-10-25 DIAGNOSIS — G894 Chronic pain syndrome: Secondary | ICD-10-CM | POA: Diagnosis not present

## 2019-10-25 DIAGNOSIS — M150 Primary generalized (osteo)arthritis: Secondary | ICD-10-CM | POA: Diagnosis not present

## 2019-10-25 DIAGNOSIS — M961 Postlaminectomy syndrome, not elsewhere classified: Secondary | ICD-10-CM | POA: Diagnosis not present

## 2019-10-31 DIAGNOSIS — Z79899 Other long term (current) drug therapy: Secondary | ICD-10-CM | POA: Diagnosis not present

## 2019-10-31 DIAGNOSIS — M545 Low back pain: Secondary | ICD-10-CM | POA: Diagnosis not present

## 2019-10-31 DIAGNOSIS — G8929 Other chronic pain: Secondary | ICD-10-CM | POA: Diagnosis not present

## 2019-11-08 DIAGNOSIS — G8929 Other chronic pain: Secondary | ICD-10-CM | POA: Diagnosis not present

## 2019-11-08 DIAGNOSIS — I10 Essential (primary) hypertension: Secondary | ICD-10-CM | POA: Diagnosis not present

## 2019-11-08 DIAGNOSIS — M5137 Other intervertebral disc degeneration, lumbosacral region: Secondary | ICD-10-CM | POA: Diagnosis not present

## 2020-01-12 IMAGING — CR PORTABLE CHEST - 1 VIEW
1 series · 1 of 1 positions shown · non-contrast
Comparison: Radiographs 06/10/2018.

CLINICAL DATA: Dizziness and hypotension this morning. Former
smoker.

EXAM:
PORTABLE CHEST 1 VIEW

[portable]
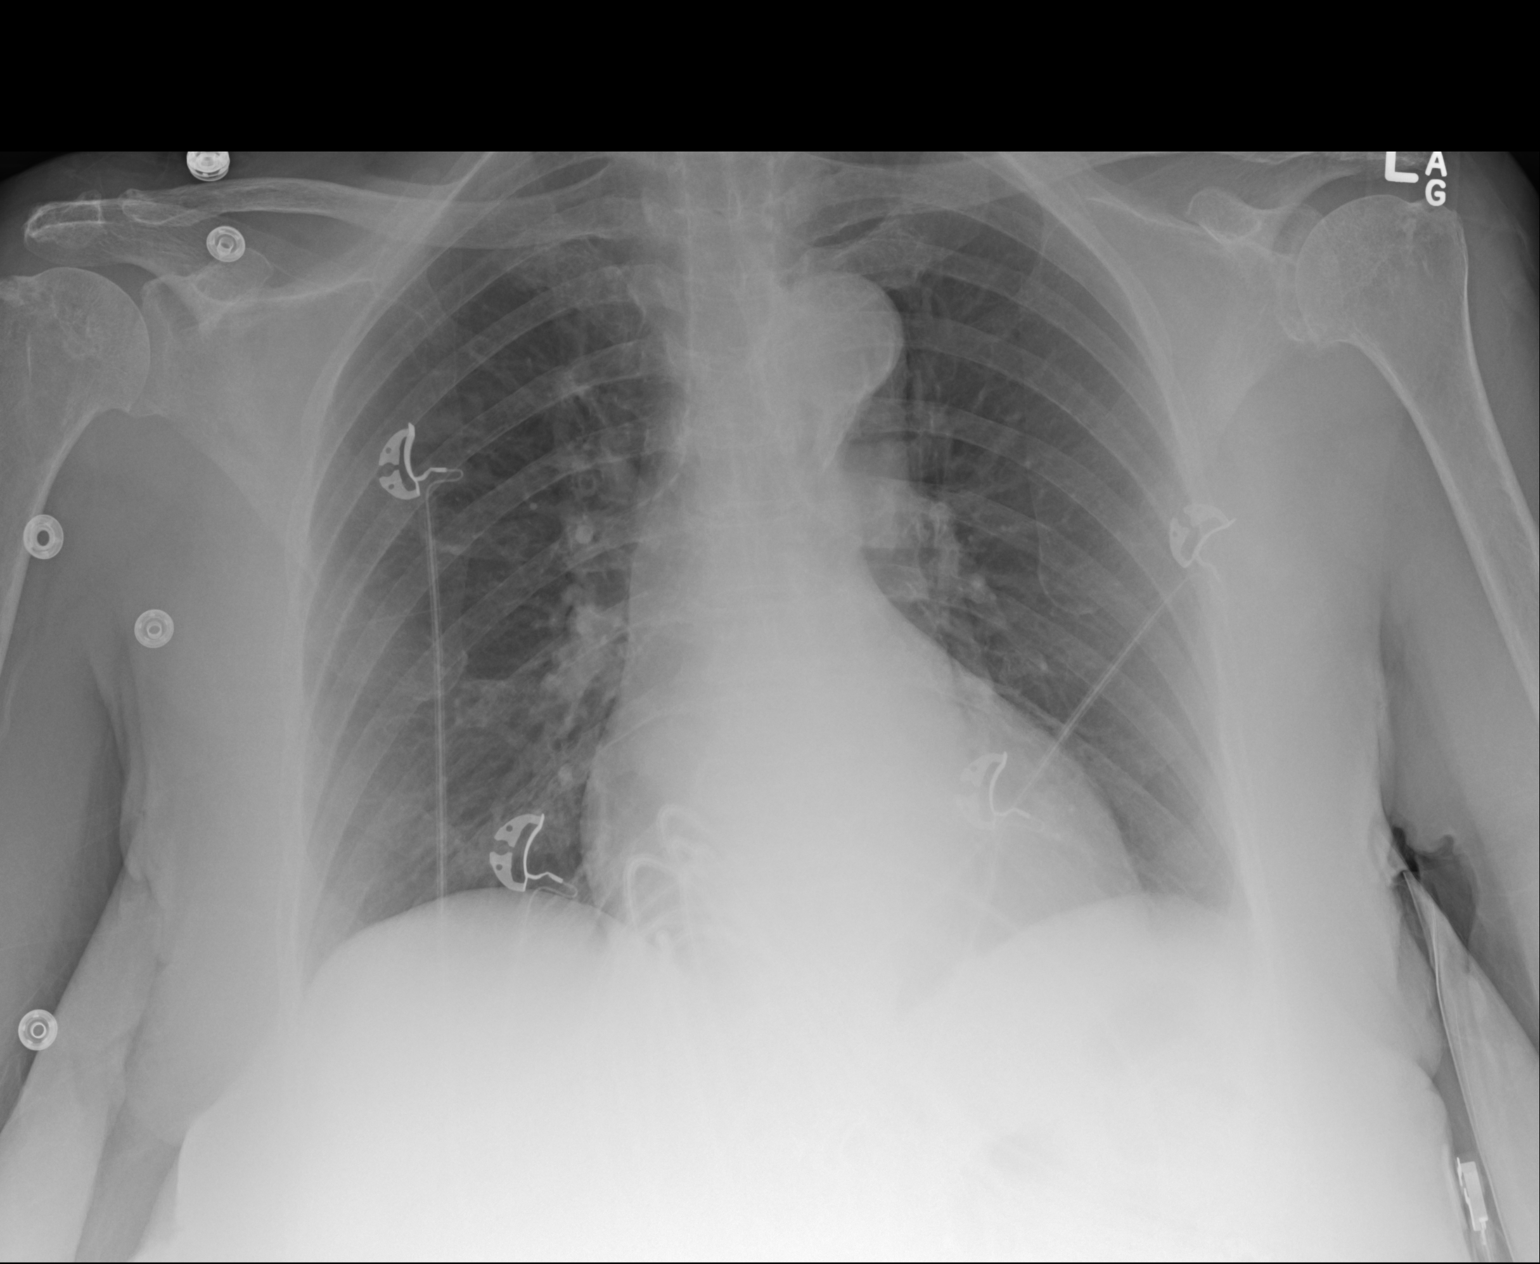

[1 of 1 positions shown; findings below may reference images not displayed]

FINDINGS: 1972 hours. The heart size and mediastinal contours are stable.
There is aortic atherosclerosis. The lungs are clear. There is no
pleural effusion or pneumothorax. No acute osseous findings are
evident. Telemetry leads overlie the chest.
IMPRESSION: Stable chest.  No acute cardiopulmonary process.
